# Patient Record
Sex: Male | Born: 1980 | Race: White | Hispanic: No | Marital: Single | Smoking: Current every day smoker
Health system: Southern US, Community
[De-identification: ages and names within clinical notes are randomized; demographics above are authoritative.]

## PROBLEM LIST (undated history)

## (undated) DIAGNOSIS — M1990 Unspecified osteoarthritis, unspecified site: Secondary | ICD-10-CM

## (undated) DIAGNOSIS — G43909 Migraine, unspecified, not intractable, without status migrainosus: Secondary | ICD-10-CM

## (undated) HISTORY — PX: TONSILLECTOMY: SUR1361

## (undated) HISTORY — PX: CARPAL TUNNEL RELEASE: SHX101

## (undated) HISTORY — PX: OTHER SURGICAL HISTORY: SHX169

---

## 2008-10-13 ENCOUNTER — Ambulatory Visit: Payer: Self-pay | Admitting: Internal Medicine

## 2012-10-29 ENCOUNTER — Emergency Department: Payer: Self-pay | Admitting: Emergency Medicine

## 2017-02-15 ENCOUNTER — Emergency Department
Admission: EM | Admit: 2017-02-15 | Discharge: 2017-02-15 | Disposition: A | Discharge status: Home / Self Care | Payer: Self-pay | Attending: Emergency Medicine | Admitting: Emergency Medicine

## 2017-02-15 ENCOUNTER — Encounter: Payer: Self-pay | Admitting: Emergency Medicine

## 2017-02-15 ENCOUNTER — Emergency Department: Payer: Self-pay

## 2017-02-15 DIAGNOSIS — M79642 Pain in left hand: Secondary | ICD-10-CM | POA: Insufficient documentation

## 2017-02-15 DIAGNOSIS — F17210 Nicotine dependence, cigarettes, uncomplicated: Secondary | ICD-10-CM | POA: Insufficient documentation

## 2017-02-15 MED ORDER — IBUPROFEN 600 MG PO TABS: 600.0000 mg | ORAL_TABLET | Freq: Four times a day (QID) | ORAL | 0 refills | Status: DC | PRN

## 2017-02-15 NOTE — ED Triage Notes (Signed)
Patient states that he was moving something earlier today and hit his hand on a door jam. Patient states that he continues to have pain to his left hand.

## 2017-02-15 NOTE — ED Provider Notes (Signed)
[facility] Emergency Department Provider Note  ____________________________________________  Time seen: Approximately 11:25 PM  I have reviewed the triage vital signs and the nursing notes.   HISTORY  Chief Complaint Hand Pain    HPI Jon Medina is a 36 y.o. male that presents to the Emergency Department with swelling to left hand. Patient states that his hand hit a door frame today. Pain is primarily over pinky side of his finger. No alleviating measures have been attempted. No additional injuries. No numbness, tingling.   History reviewed. No pertinent past medical history.  There are no active problems to display for this patient.   History reviewed. No pertinent surgical history.  Prior to Admission medications   Medication Sig Start Date End Date Taking? Authorizing Provider  ibuprofen (ADVIL,MOTRIN) 600 MG tablet Take 1 tablet (600 mg total) by mouth every 6 (six) hours as needed. 02/15/17   Enid Derry, PA-C    Allergies Patient has no known allergies.  No family history on file.  Social History Social History  Substance Use Topics  . Smoking status: Current Every Day Smoker  . Smokeless tobacco: Never Used  . Alcohol use Not on file     Review of Systems  Constitutional: No fever/chills Cardiovascular: No chest pain. Respiratory: No SOB. Gastrointestinal: No abdominal pain.  No nausea, no vomiting.  Musculoskeletal: Positive for hand pain. Skin: Negative for rash, abrasions, lacerations, ecchymosis. Neurological: Negative for numbness or tingling   ____________________________________________   PHYSICAL EXAM:  VITAL SIGNS: ED Triage Vitals  Enc Vitals Group     BP 02/15/17 1908 137/90     Pulse Rate 02/15/17 1907 76     Resp 02/15/17 1907 18     Temp 02/15/17 1907 97.6 F (36.4 C)     Temp Source 02/15/17 1907 Oral     SpO2 02/15/17 1907 100 %     Weight 02/15/17 1908 180 lb (81.6 kg)     Height 02/15/17 1908   (1.854 m)     Head Circumference --      Peak Flow --      Pain Score 02/15/17 1907 8     Pain Loc --      Pain Edu? --      Excl. in GC? --      Constitutional: Alert and oriented. Well appearing and in no acute distress. Eyes: Conjunctivae are normal. PERRL. EOMI. Head: Atraumatic. ENT:      Ears:      Nose: No congestion/rhinnorhea.      Mouth/Throat: Mucous membranes are moist.  Neck: No stridor.  Cardiovascular: Normal rate, regular rhythm.  Good peripheral circulation. 2+ radial pulses. Respiratory: Normal respiratory effort without tachypnea or retractions. Lungs CTAB. Good air entry to the bases with no decreased or absent breath sounds. Musculoskeletal: Full range of motion to all extremities. No gross deformities appreciated. Mild swelling and tenderness to palpation over fifth metacarpal. Neurologic:  Normal speech and language. No gross focal neurologic deficits are appreciated.  Skin:  Skin is warm, dry and intact. No rash noted.   ____________________________________________   LABS (all labs ordered are listed, but only abnormal results are displayed)  Labs Reviewed - No data to display ____________________________________________  EKG   ____________________________________________  RADIOLOGY Lexine Baton, personally viewed and evaluated these images (plain radiographs) as part of my medical decision making, as well as reviewing the written report by the radiologist.  Dg Hand Complete Left  Result Date: 02/15/2017 CLINICAL DATA:  Hand injury EXAM: LEFT HAND - COMPLETE 3+ VIEW COMPARISON:  None. FINDINGS: No acute displaced fracture or malalignment. Old ulnar styloid process deformity. No radiopaque foreign body IMPRESSION: No acute osseous abnormality Electronically Signed   By: Jasmine Pang M.D.   On: 02/15/2017 19:32    ____________________________________________    PROCEDURES  Procedure(s) performed:    Procedures    Medications  - No data to display   ____________________________________________   INITIAL IMPRESSION / ASSESSMENT AND PLAN / ED COURSE  Pertinent labs & imaging results that were available during my care of the patient were reviewed by me and considered in my medical decision making (see chart for details).  Review of the [place] CSRS was performed in accordance of the [facility] prior to dispensing any controlled drugs.     Patient presented to the emergency department for evaluation of hand pain after injury. Vital signs and exam are reassuring. X-ray negative for acute bony abnormalities. Patient will be discharged home with prescriptions for ibuprofen. Patient is to follow up with PCP as directed. Patient is given ED precautions to return to the ED for any worsening or new symptoms.     ____________________________________________  FINAL CLINICAL IMPRESSION(S) / ED DIAGNOSES  Final diagnoses:  Left hand pain      NEW MEDICATIONS STARTED DURING THIS VISIT:  Discharge Medication List as of 02/15/2017  8:12 PM    START taking these medications   Details  ibuprofen (ADVIL,MOTRIN) 600 MG tablet Take 1 tablet (600 mg total) by mouth every 6 (six) hours as needed., Starting Wed 02/15/2017, Print            This chart was dictated using voice recognition software/Dragon. Despite best efforts to proofread, errors can occur which can change the meaning. Any change was purely unintentional.    Enid Derry, PA-C 02/15/17 2328    Merrily Brittle, MD 02/15/17 [phone]

## 2017-02-15 NOTE — ED Notes (Signed)
Pt has pain and swelling to left hand.  Pt struck hand on a door frame today.

## 2019-06-11 ENCOUNTER — Other Ambulatory Visit: Payer: Self-pay

## 2019-06-11 ENCOUNTER — Emergency Department
Admission: EM | Admit: 2019-06-11 | Discharge: 2019-06-11 | Disposition: A | Discharge status: Home / Self Care | Payer: Self-pay | Attending: Emergency Medicine | Admitting: Emergency Medicine

## 2019-06-11 ENCOUNTER — Encounter: Payer: Self-pay | Admitting: Emergency Medicine

## 2019-06-11 DIAGNOSIS — F17200 Nicotine dependence, unspecified, uncomplicated: Secondary | ICD-10-CM | POA: Insufficient documentation

## 2019-06-11 DIAGNOSIS — G43901 Migraine, unspecified, not intractable, with status migrainosus: Secondary | ICD-10-CM | POA: Insufficient documentation

## 2019-06-11 HISTORY — DX: Migraine, unspecified, not intractable, without status migrainosus: G43.909

## 2019-06-11 MED ORDER — DIPHENHYDRAMINE HCL 50 MG/ML IJ SOLN
50.0000 mg | Freq: Once | INTRAMUSCULAR | Status: AC
  Administered 2019-06-11: 50 mg via INTRAMUSCULAR
  Filled 2019-06-11: qty 1

## 2019-06-11 MED ORDER — METOCLOPRAMIDE HCL 10 MG PO TABS: 10.0000 mg | ORAL_TABLET | Freq: Four times a day (QID) | ORAL | 0 refills | Status: DC | PRN

## 2019-06-11 MED ORDER — DIPHENHYDRAMINE HCL 25 MG PO CAPS: 50.0000 mg | ORAL_CAPSULE | Freq: Four times a day (QID) | ORAL | 0 refills | Status: DC | PRN

## 2019-06-11 MED ORDER — KETOROLAC TROMETHAMINE 10 MG PO TABS: 10.0000 mg | ORAL_TABLET | Freq: Four times a day (QID) | ORAL | 0 refills | Status: DC | PRN

## 2019-06-11 MED ORDER — METOCLOPRAMIDE HCL 5 MG/ML IJ SOLN
10.0000 mg | Freq: Once | INTRAMUSCULAR | Status: AC
  Administered 2019-06-11: 10 mg via INTRAMUSCULAR
  Filled 2019-06-11: qty 2

## 2019-06-11 MED ORDER — KETOROLAC TROMETHAMINE 60 MG/2ML IM SOLN
30.0000 mg | Freq: Once | INTRAMUSCULAR | Status: AC
  Administered 2019-06-11: 09:00:00 30 mg via INTRAMUSCULAR
  Filled 2019-06-11: qty 2

## 2019-06-11 NOTE — Discharge Instructions (Signed)
Continue taking toradol (ketorolac), reglan (metoclopramide), and benadryl (diphenhydramine) as prescribed every 6 hours to control headache symptoms.  You should follow up with neurology for evaluation of your recurrent headaches.  Return to the ER right away if you have sudden, severe worsening of the headache, passing out, fever, vision loss, or weakness on one side of your body.

## 2019-06-11 NOTE — ED Provider Notes (Signed)
[facility] Emergency Department Provider Note  ____________________________________________  Time seen: Approximately 8:29 AM  I have reviewed the triage vital signs and the nursing notes.   HISTORY  Chief Complaint Migraine    HPI Jon Medina is a 39 y.o. male with a history of migraine headaches and polysubstance abuse who comes to the ED c/o severe R retro-orbital headache with gradual onset 2 days ago, constant, worse with bright light, no alleviating factors.  Not thunderclap in onset. No associated weakness, paresthesia, or numbness. similar to prior headache syndrome he gets roughly twice per month.   Review of EMR shows he was admitted to [facility] Sept 2020 with fever and AMS. Had extensive workup to eval for spinal cord process, endocarditis, was eventually diagnosed with lumbar discitis and discharged on abx after full spine imaging and CT head.  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 LN bx negative for malignancy features. TEE normal.     Past Medical History:  Diagnosis Date  . Migraine      There are no problems to display for this patient.    History reviewed. No pertinent surgical history.   Prior to Admission medications   Medication Sig Start Date End Date Taking? Authorizing Provider  naproxen sodium (ALEVE) 220 MG tablet Take 220 mg by mouth 2 (two) times daily as needed.   Yes [provider]  diphenhydrAMINE (BENADRYL) 25 mg capsule Take 2 capsules (50 mg total) by mouth every 6 (six) hours as needed. 06/11/19   Sharman Cheek, MD  ketorolac (TORADOL) 10 MG tablet Take 1 tablet (10 mg total) by mouth every 6 (six) hours as needed for moderate pain. 06/11/19   Sharman Cheek, MD  metoCLOPramide (REGLAN) 10 MG tablet Take 1 tablet (10 mg total) by mouth every 6 (six) hours as needed. 06/11/19   Sharman Cheek, MD     Allergies Patient has no known allergies.   History reviewed. No pertinent family history.  Social History Social History   Tobacco Use  . Smoking status: Current Every Day Smoker  . Smokeless tobacco: Never Used  Substance Use Topics  . Alcohol use: Not on file  . Drug use: Not on file  suboxone use, prior amphetamine use  Review of Systems  Constitutional:   No fever or chills.  ENT:   No sore throat. No rhinorrhea. Cardiovascular:   No chest pain or syncope. Respiratory:   No dyspnea or cough. Gastrointestinal:   Negative for abdominal pain, vomiting and diarrhea.  Musculoskeletal:   Negative for focal pain or swelling All other systems reviewed and are negative except as documented above in ROS and HPI.  ____________________________________________   PHYSICAL EXAM:  VITAL SIGNS: ED Triage Vitals  Enc Vitals Group     BP 06/11/19 0745 124/79     Pulse Rate 06/11/19 0745 71     Resp 06/11/19 0745 16     Temp 06/11/19 0747 98 F (36.7 C)     Temp Source 06/11/19 0747 Oral     SpO2 06/11/19 0745 100 %     Weight 06/11/19 0744 180 lb (81.6 kg)     Height 06/11/19 0744 6\' 1"  (1.854 m)     Head Circumference --      Peak Flow --      Pain Score 06/11/19 0744 7     Pain Loc --  Pain Edu? --      Excl. in GC? --     Vital signs reviewed, nursing assessments reviewed.   Constitutional:   Alert and oriented. Non-toxic appearance. Eyes:   Conjunctivae are normal. EOMI. PERRL. ENT      Head:   Normocephalic and atraumatic. Symmetric TA pulsations, non tender      Nose:   Wearing a mask.      Mouth/Throat:   Wearing a mask.      Neck:   No meningismus. Full ROM. Hematological/Lymphatic/Immunilogical:   No cervical lymphadenopathy. Cardiovascular:   RRR. Symmetric bilateral radial and DP pulses.  No murmurs. Cap refill less than 2 seconds. Respiratory:   Normal respiratory effort without tachypnea/retractions. Breath sounds are clear and  equal bilaterally. No wheezes/rales/rhonchi. Musculoskeletal:   Normal range of motion in all extremities. No joint effusions.  No lower extremity tenderness.  No edema. Neurologic:   Normal speech and language.  CN 3-12 intact Normal cerebellar function Motor grossly intact. No acute focal neurologic deficits are appreciated.  Skin:    Skin is warm, dry and intact. No rash noted.  No petechiae, purpura, or bullae.  ____________________________________________    LABS (pertinent positives/negatives) (all labs ordered are listed, but only abnormal results are displayed) Labs Reviewed - No data to display ____________________________________________   EKG    ____________________________________________    RADIOLOGY  No results found.  ____________________________________________   PROCEDURES Procedures  ____________________________________________    CLINICAL IMPRESSION / ASSESSMENT AND PLAN / ED COURSE  Medications ordered in the ED: Medications  ketorolac (TORADOL) injection 30 mg (30 mg Intramuscular Given 06/11/19 0841)  metoCLOPramide (REGLAN) injection 10 mg (10 mg Intramuscular Given 06/11/19 0841)  diphenhydrAMINE (BENADRYL) injection 50 mg (50 mg Intramuscular Given 06/11/19 0841)    Pertinent labs & imaging results that were available during my care of the patient were reviewed by me and considered in my medical decision making (see chart for details).  Jon Medina was evaluated in Emergency Department on 06/11/2019 for the symptoms described in the history of present illness. He was evaluated in the context of the global COVID-19 pandemic, which necessitated consideration that the patient might be at risk for infection with the SARS-CoV-2 virus that causes COVID-19. Institutional protocols and algorithms that pertain to the evaluation of patients at risk for COVID-19 are in a state of rapid change based on information released by regulatory bodies including  the CDC and federal and state organizations. These policies and algorithms were followed during the patient's care in the ED.   Pt p/w severe R temporal headache, c/w his established frequent headache pattern. Vital signs are normal.  Considering the patient's symptoms, medical history, and physical examination today, I have low suspicion for ischemic stroke, intracranial hemorrhage, meningitis, encephalitis, carotid or vertebral dissection, venous sinus thrombosis, MS, intracranial hypertension, glaucoma, CRAO, CRVO, or temporal arteritis.  Will tx symptoms with IM toradol 30mg , Reglan 10mg , Benadryl 50mg  and reassess.  ----------------------------------------- 10:37 AM on 06/11/2019 -----------------------------------------  Feels much better. Will continue reglan, toradol, benadryl PO, f/u neuro. Return precautions for worsening symptoms.       ____________________________________________   FINAL CLINICAL IMPRESSION(S) / ED DIAGNOSES    Final diagnoses:  Migraine with status migrainosus, not intractable, unspecified migraine type     ED Discharge Orders         Ordered    ketorolac (TORADOL) 10 MG tablet  Every 6 hours PRN     06/11/19 1035    diphenhydrAMINE (  BENADRYL) 25 mg capsule  Every 6 hours PRN     06/11/19 1035    metoCLOPramide (REGLAN) 10 MG tablet  Every 6 hours PRN     06/11/19 1035          Portions of this note were generated with dragon dictation software. Dictation errors may occur despite best attempts at proofreading.   Carrie Mew, MD 06/11/19 1038

## 2019-06-11 NOTE — ED Triage Notes (Addendum)
Pt here for migraine. Hx of same but worse than normal migraine per pt.  + photophobia and nausea.  C/o blurry vision to right eye. Pain to right side.  Ambulatory. VSS.  Had headache yesterday but got a little better, then came back worse today

## 2019-08-26 ENCOUNTER — Encounter: Payer: Self-pay | Admitting: Emergency Medicine

## 2019-08-26 ENCOUNTER — Emergency Department: Payer: Medicaid Other

## 2019-08-26 ENCOUNTER — Other Ambulatory Visit: Payer: Self-pay

## 2019-08-26 DIAGNOSIS — W01198A Fall on same level from slipping, tripping and stumbling with subsequent striking against other object, initial encounter: Secondary | ICD-10-CM | POA: Insufficient documentation

## 2019-08-26 DIAGNOSIS — F17200 Nicotine dependence, unspecified, uncomplicated: Secondary | ICD-10-CM | POA: Insufficient documentation

## 2019-08-26 DIAGNOSIS — S32010A Wedge compression fracture of first lumbar vertebra, initial encounter for closed fracture: Secondary | ICD-10-CM | POA: Insufficient documentation

## 2019-08-26 DIAGNOSIS — Y939 Activity, unspecified: Secondary | ICD-10-CM | POA: Insufficient documentation

## 2019-08-26 DIAGNOSIS — Y999 Unspecified external cause status: Secondary | ICD-10-CM | POA: Insufficient documentation

## 2019-08-26 DIAGNOSIS — R519 Headache, unspecified: Secondary | ICD-10-CM | POA: Insufficient documentation

## 2019-08-26 DIAGNOSIS — R079 Chest pain, unspecified: Secondary | ICD-10-CM | POA: Insufficient documentation

## 2019-08-26 DIAGNOSIS — R109 Unspecified abdominal pain: Secondary | ICD-10-CM | POA: Insufficient documentation

## 2019-08-26 DIAGNOSIS — Y92008 Other place in unspecified non-institutional (private) residence as the place of occurrence of the external cause: Secondary | ICD-10-CM | POA: Insufficient documentation

## 2019-08-26 DIAGNOSIS — M542 Cervicalgia: Secondary | ICD-10-CM | POA: Insufficient documentation

## 2019-08-26 LAB — COMPREHENSIVE METABOLIC PANEL
ALT: 26 U/L (ref 0–44)
AST: 41 U/L (ref 15–41)
Albumin: 4.3 g/dL (ref 3.5–5.0)
Alkaline Phosphatase: 50 U/L (ref 38–126)
Anion gap: 11 (ref 5–15)
BUN: 23 mg/dL — ABNORMAL HIGH (ref 6–20)
CO2: 20 mmol/L — ABNORMAL LOW (ref 22–32)
Calcium: 9.2 mg/dL (ref 8.9–10.3)
Chloride: 108 mmol/L (ref 98–111)
Creatinine, Ser: 0.91 mg/dL (ref 0.61–1.24)
GFR calc Af Amer: >60 mL/min (ref >60)
GFR calc non Af Amer: >60 mL/min (ref >60)
Glucose, Bld: 117 mg/dL — ABNORMAL HIGH (ref 70–99)
Potassium: 4.1 mmol/L (ref 3.5–5.1)
Sodium: 139 mmol/L (ref 135–145)
Total Bilirubin: 1.5 mg/dL — ABNORMAL HIGH (ref 0.3–1.2)
Total Protein: 6.9 g/dL (ref 6.5–8.1)

## 2019-08-26 LAB — CBC WITH DIFFERENTIAL/PLATELET
Abs Immature Granulocytes: 0.06 10*3/uL (ref 0.00–0.07)
Basophils Absolute: 0 10*3/uL (ref 0.0–0.1)
Basophils Relative: 0 %
Eosinophils Absolute: 0.1 10*3/uL (ref 0.0–0.5)
Eosinophils Relative: 1 %
HCT: 38 % — ABNORMAL LOW (ref 39.0–52.0)
Hemoglobin: 13.1 g/dL (ref 13.0–17.0)
Immature Granulocytes: 1 %
Lymphocytes Relative: 30 %
Lymphs Abs: 2.5 10*3/uL (ref 0.7–4.0)
MCH: 31.1 pg (ref 26.0–34.0)
MCHC: 34.5 g/dL (ref 30.0–36.0)
MCV: 90.3 fL (ref 80.0–100.0)
Monocytes Absolute: 0.9 10*3/uL (ref 0.1–1.0)
Monocytes Relative: 11 %
Neutro Abs: 4.8 10*3/uL (ref 1.7–7.7)
Neutrophils Relative %: 57 %
Platelets: 241 10*3/uL (ref 150–400)
RBC: 4.21 MIL/uL — ABNORMAL LOW (ref 4.22–5.81)
RDW: 12.5 % (ref 11.5–15.5)
WBC: 8.4 10*3/uL (ref 4.0–10.5)
nRBC: 0 % (ref 0.0–0.2)

## 2019-08-26 LAB — ETHANOL: Alcohol, Ethyl (B): <10 mg/dL (ref <10)

## 2019-08-26 MED ORDER — SODIUM CHLORIDE 0.9 % IV BOLUS
1000.0000 mL | Freq: Once | INTRAVENOUS | Status: AC
  Administered 2019-08-27: 1000 mL via INTRAVENOUS

## 2019-08-26 MED ORDER — IOHEXOL 300 MG/ML  SOLN
100.0000 mL | Freq: Once | INTRAMUSCULAR | Status: AC | PRN
  Administered 2019-08-26: 100 mL via INTRAVENOUS

## 2019-08-26 NOTE — ED Triage Notes (Signed)
Pt to ED via POV with c/o neck and back pain/ Pt st he jumped off his roof onto a trampoline and immediatly heard a "snap and crunch"/ Pt  St he landed on his bottom and then back. Pt in obvious distress. A cervical collar was placed onto the pt. Pt walked into the ER and drove himself here. Pt A&Ox4

## 2019-08-27 ENCOUNTER — Emergency Department
Admission: EM | Admit: 2019-08-27 | Discharge: 2019-08-27 | Disposition: A | Discharge status: Home / Self Care | Payer: Medicaid Other | Attending: Emergency Medicine | Admitting: Emergency Medicine

## 2019-08-27 DIAGNOSIS — R52 Pain, unspecified: Secondary | ICD-10-CM

## 2019-08-27 DIAGNOSIS — S32010A Wedge compression fracture of first lumbar vertebra, initial encounter for closed fracture: Secondary | ICD-10-CM

## 2019-08-27 MED ORDER — ONDANSETRON HCL 4 MG/2ML IJ SOLN
4.0000 mg | Freq: Once | INTRAMUSCULAR | Status: AC
  Administered 2019-08-27: 4 mg via INTRAVENOUS
  Filled 2019-08-27: qty 2

## 2019-08-27 MED ORDER — MORPHINE SULFATE (PF) 4 MG/ML IV SOLN
4.0000 mg | Freq: Once | INTRAVENOUS | Status: AC
  Administered 2019-08-27: 4 mg via INTRAVENOUS
  Filled 2019-08-27: qty 1

## 2019-08-27 MED ORDER — OXYCODONE-ACETAMINOPHEN 5-325 MG PO TABS: 1.0000 | ORAL_TABLET | ORAL | 0 refills | Status: AC | PRN

## 2019-08-27 NOTE — ED Notes (Signed)
Applied ccollar to pts neck

## 2019-08-27 NOTE — ED Provider Notes (Addendum)
[facility] Emergency Department Provider Note  ____________________________________________   First MD Initiated Contact with Patient 08/27/19 [id number]     (approximate)  I have reviewed the triage vital signs and the nursing notes.   HISTORY  Chief Complaint Back Pain and Neck Pain    HPI Jon Medina is a 39 y.o. male presents to the emergency department secondary to neck and low back pain after jumping off his roof and onto a trampoline.  Patient states that he fell on his buttocks and immediately felt a "pop and crunch in his lower back with 10 out of 10 pain.  Patient denies any lower extremity weakness numbness.  Patient denies any head injury or loss of consciousness.  Patient denies any chest or abdominal pain.       Past Medical History:  Diagnosis Date  . Migraine     There are no problems to display for this patient.   History reviewed. No pertinent surgical history.  Prior to Admission medications   Medication Sig Start Date End Date Taking? Authorizing Provider  acetaminophen (TYLENOL) 325 MG tablet Take 650 mg by mouth every 6 (six) hours as needed for mild pain, moderate pain, fever or headache.   Yes [provider]  aspirin-sod bicarb-citric acid (ALKA-SELTZER) 325 MG TBEF tablet Take 325 mg by mouth every 6 (six) hours as needed.   Yes [provider]  buprenorphine-naloxone (SUBOXONE) 8-2 mg SUBL SL tablet Place 1 tablet under the tongue daily. 02/22/19   [provider]    Allergies Patient has no known allergies.  History reviewed. No pertinent family history.  Social History Social History   Tobacco Use  . Smoking status: Current Every Day Smoker  . Smokeless tobacco: Never Used  Substance Use Topics  . Alcohol use: Not on file  . Drug use: Not on file    Review of Systems Constitutional: No fever/chills Eyes: No visual changes. ENT: No sore throat. Cardiovascular: Denies chest  pain. Respiratory: Denies shortness of breath. Gastrointestinal: No abdominal pain.  No nausea, no vomiting.  No diarrhea.  No constipation. Genitourinary: Negative for dysuria. Musculoskeletal: Positive for neck pain.  Positive for back pain. Integumentary: Negative for rash. Neurological: Negative for headaches, focal weakness or numbness.  ____________________________________________   PHYSICAL EXAM:  VITAL SIGNS: ED Triage Vitals  Enc Vitals Group     BP 08/26/19 2317 (!) 154/87     Pulse Rate 08/26/19 2250 (!) 130     Resp 08/26/19 2250 (!) 25     Temp 08/26/19 2317 98.2 F (36.8 C)     Temp Source 08/26/19 2317 Oral     SpO2 08/26/19 2317 99 %     Weight 08/26/19 2301 83.9 kg (185 lb)     Height 08/26/19 2301 1.854 m (6\' 1" )     Head Circumference --      Peak Flow --      Pain Score 08/26/19 2300 10     Pain Loc --      Pain Edu? --      Excl. in GC? --     Constitutional: Alert and oriented.  Apparent discomfort Eyes: Conjunctivae are normal.  Head: Atraumatic. Mouth/Throat: Patient is wearing a mask. Neck: No stridor.  No meningeal signs.   Cardiovascular: Normal rate, regular rhythm. Good peripheral circulation. Grossly normal heart sounds. Respiratory: Normal respiratory effort.  No retractions. Gastrointestinal: Soft and nontender. No distention.  Musculoskeletal: Pain to palpation L1 and L2.  Pain  with palpation of lumbar paraspinal muscle. Neurologic:  Normal speech and language. No gross focal neurologic deficits are appreciated.  Skin:  Skin is warm, dry and intact. Psychiatric: Mood and affect are normal. Speech and behavior are normal.  ____________________________________________   LABS (all labs ordered are listed, but only abnormal results are displayed)    RADIOLOGY I, Guin N Quinlan Mcfall, personally viewed and evaluated these images (plain radiographs) as part of my medical decision making, as well as reviewing the written report by the  radiologist.  ED MD interpretation: CT scans revealed no intracranial abnormality chest abdomen pelvis lumbar spine thoracic spine and cervical spine was also performed.  Acute compression fracture involving the superior endplate of L1 vertebral body without any retropulsion and 50% loss of height anteriorly was noted per radiologist.  Official radiology report(s): CT Head Wo Contrast  Result Date: 08/27/2019 CLINICAL DATA:  Acute pain due to trauma. Neck and back pain status post jumping off a roof onto a trampoline EXAM: CT HEAD WITHOUT CONTRAST CT CERVICAL SPINE WITHOUT CONTRAST CT CHEST, ABDOMEN AND PELVIS WITH CONTRAST CT THORACIC AND LUMBAR SPINE WITHOUT CONTRAST TECHNIQUE: Contiguous axial images were obtained from the base of the skull through the vertex without intravenous contrast. Multidetector CT imaging of the cervical spine was performed without intravenous contrast. Multiplanar CT image reconstructions were also generated. Multidetector CT imaging of the chest, abdomen and pelvis was performed following the standard protocol during bolus administration of intravenous contrast. Multiplanar CT images of the thoracic and lumbar spine were reconstructed from contemporary CT of the Chest, Abdomen, and Pelvis CONTRAST:  OMNIPAQUE IOHEXOL 300 MG/ML  SOLN COMPARISON:  None. FINDINGS: CT HEAD FINDINGS Brain: No evidence of acute infarction, hemorrhage, hydrocephalus, extra-axial collection or mass lesion/mass effect. Vascular: No hyperdense vessel or unexpected calcification. Skull: Normal. Negative for fracture or focal lesion. Sinuses/Orbits: There are mucosal retention cysts within the bilateral maxillary sinuses, right greater than left. The remaining paranasal sinuses and mastoid air cells are essentially clear. Other: None. CT CERVICAL FINDINGS Alignment: Normal. Skull base and vertebrae: No acute fracture. No primary bone lesion or focal pathologic process. Soft tissues and spinal canal: No  prevertebral fluid or swelling. No visible canal hematoma. Disc levels: Minimal disc height loss is noted the lower cervical segments. Other:  None. CT CHEST FINDINGS Cardiovascular: The heart size is normal. There is no significant pericardial effusion. No evidence for thoracic aortic aneurysm or dissection. No evidence for large centrally located pulmonary embolism. Mediastinum/Nodes: --No mediastinal or hilar lymphadenopathy. --No axillary lymphadenopathy. --No supraclavicular lymphadenopathy. --Normal thyroid gland. --The esophagus is unremarkable Lungs/Pleura: There is a 5 mm pulmonary nodule in the right lower lobe (axial series 5, image 94). There are additional scattered bilateral pulmonary nodules measure less than 5 mm. There is no pneumothorax or large pleural effusion. No focal infiltrate. Musculoskeletal: No chest wall abnormality. No acute or significant osseous findings. CT ABDOMEN PELVIS FINDINGS Hepatobiliary: The liver is normal. Normal gallbladder.There is no biliary ductal dilation. Pancreas: Normal contours without ductal dilatation. No peripancreatic fluid collection. Spleen: Unremarkable. Adrenals/Urinary Tract: --Adrenal glands: Unremarkable. --Right kidney/ureter: No hydronephrosis or radiopaque kidney stones. --Left kidney/ureter: There are nonobstructing stones in the lower pole the left kidney. --Urinary bladder: Unremarkable. Stomach/Bowel: --Stomach/Duodenum: No hiatal hernia or other gastric abnormality. Normal duodenal course and caliber. --Small bowel: Unremarkable. --Colon: Unremarkable. --Appendix: Normal. Vascular/Lymphatic: Normal course and caliber of the major abdominal vessels. --No retroperitoneal lymphadenopathy. --No mesenteric lymphadenopathy. --No pelvic or inguinal lymphadenopathy. Reproductive: Unremarkable Other: No  ascites or free air. The abdominal wall is normal. Musculoskeletal. There is an acute compression fracture involving the superior endplate of the L1  vertebral body with approximately 15% height loss anteriorly. There is no retropulsion. There are 5 lumbar type vertebral bodies. Advanced degenerative changes are noted at the L5-S1 level where there is moderate disc height loss and developing facet arthrosis. There is a unilateral pars defect at L4 without significant anterolisthesis. There is a broad-based posterior disc bulge at the L4-L5 level without significant spinal canal stenosis. There is a broad-based posterior disc bulge at the L5-S1 level resulting in mild spinal canal narrowing. IMPRESSION: 1. Acute compression fracture involving the superior endplate of the L1 vertebral body with approximately 15% height loss anteriorly, without retropulsion. 2. No acute thoracic spine fracture. 3. No acute abnormalities of the cervical spine. 4. No acute intracranial abnormality. 5. No solid organ injury within the chest, abdomen, or pelvis. 6. Nonobstructing left-sided nephrolithiasis. 7. Pulmonary nodules, measuring up to 5 mm. No follow-up needed if patient is low risk. Non-contrast chest CT can be considered in 12 months if patient is high risk. This recommendation follows the consensus statement: Guidelines for Management of Incidental Pulmonary Nodules Detected on CT Images: From the Fleischner Society 2017; Radiology 2017; 284:228-243. Electronically Signed   By: Katherine Mantle M.D.   On: 08/27/2019 00:53   CT Chest W Contrast  Result Date: 08/27/2019 CLINICAL DATA:  Acute pain due to trauma. Neck and back pain status post jumping off a roof onto a trampoline EXAM: CT HEAD WITHOUT CONTRAST CT CERVICAL SPINE WITHOUT CONTRAST CT CHEST, ABDOMEN AND PELVIS WITH CONTRAST CT THORACIC AND LUMBAR SPINE WITHOUT CONTRAST TECHNIQUE: Contiguous axial images were obtained from the base of the skull through the vertex without intravenous contrast. Multidetector CT imaging of the cervical spine was performed without intravenous contrast. Multiplanar CT image  reconstructions were also generated. Multidetector CT imaging of the chest, abdomen and pelvis was performed following the standard protocol during bolus administration of intravenous contrast. Multiplanar CT images of the thoracic and lumbar spine were reconstructed from contemporary CT of the Chest, Abdomen, and Pelvis CONTRAST:  OMNIPAQUE IOHEXOL 300 MG/ML  SOLN COMPARISON:  None. FINDINGS: CT HEAD FINDINGS Brain: No evidence of acute infarction, hemorrhage, hydrocephalus, extra-axial collection or mass lesion/mass effect. Vascular: No hyperdense vessel or unexpected calcification. Skull: Normal. Negative for fracture or focal lesion. Sinuses/Orbits: There are mucosal retention cysts within the bilateral maxillary sinuses, right greater than left. The remaining paranasal sinuses and mastoid air cells are essentially clear. Other: None. CT CERVICAL FINDINGS Alignment: Normal. Skull base and vertebrae: No acute fracture. No primary bone lesion or focal pathologic process. Soft tissues and spinal canal: No prevertebral fluid or swelling. No visible canal hematoma. Disc levels: Minimal disc height loss is noted the lower cervical segments. Other:  None. CT CHEST FINDINGS Cardiovascular: The heart size is normal. There is no significant pericardial effusion. No evidence for thoracic aortic aneurysm or dissection. No evidence for large centrally located pulmonary embolism. Mediastinum/Nodes: --No mediastinal or hilar lymphadenopathy. --No axillary lymphadenopathy. --No supraclavicular lymphadenopathy. --Normal thyroid gland. --The esophagus is unremarkable Lungs/Pleura: There is a 5 mm pulmonary nodule in the right lower lobe (axial series 5, image 94). There are additional scattered bilateral pulmonary nodules measure less than 5 mm. There is no pneumothorax or large pleural effusion. No focal infiltrate. Musculoskeletal: No chest wall abnormality. No acute or significant osseous findings. CT ABDOMEN PELVIS  FINDINGS Hepatobiliary: The liver is normal.  Normal gallbladder.There is no biliary ductal dilation. Pancreas: Normal contours without ductal dilatation. No peripancreatic fluid collection. Spleen: Unremarkable. Adrenals/Urinary Tract: --Adrenal glands: Unremarkable. --Right kidney/ureter: No hydronephrosis or radiopaque kidney stones. --Left kidney/ureter: There are nonobstructing stones in the lower pole the left kidney. --Urinary bladder: Unremarkable. Stomach/Bowel: --Stomach/Duodenum: No hiatal hernia or other gastric abnormality. Normal duodenal course and caliber. --Small bowel: Unremarkable. --Colon: Unremarkable. --Appendix: Normal. Vascular/Lymphatic: Normal course and caliber of the major abdominal vessels. --No retroperitoneal lymphadenopathy. --No mesenteric lymphadenopathy. --No pelvic or inguinal lymphadenopathy. Reproductive: Unremarkable Other: No ascites or free air. The abdominal wall is normal. Musculoskeletal. There is an acute compression fracture involving the superior endplate of the L1 vertebral body with approximately 15% height loss anteriorly. There is no retropulsion. There are 5 lumbar type vertebral bodies. Advanced degenerative changes are noted at the L5-S1 level where there is moderate disc height loss and developing facet arthrosis. There is a unilateral pars defect at L4 without significant anterolisthesis. There is a broad-based posterior disc bulge at the L4-L5 level without significant spinal canal stenosis. There is a broad-based posterior disc bulge at the L5-S1 level resulting in mild spinal canal narrowing. IMPRESSION: 1. Acute compression fracture involving the superior endplate of the L1 vertebral body with approximately 15% height loss anteriorly, without retropulsion. 2. No acute thoracic spine fracture. 3. No acute abnormalities of the cervical spine. 4. No acute intracranial abnormality. 5. No solid organ injury within the chest, abdomen, or pelvis. 6. Nonobstructing  left-sided nephrolithiasis. 7. Pulmonary nodules, measuring up to 5 mm. No follow-up needed if patient is low risk. Non-contrast chest CT can be considered in 12 months if patient is high risk. This recommendation follows the consensus statement: Guidelines for Management of Incidental Pulmonary Nodules Detected on CT Images: From the Fleischner Society 2017; Radiology 2017; 284:228-243. Electronically Signed   By: Katherine Mantle M.D.   On: 08/27/2019 00:53   CT Cervical Spine Wo Contrast  Result Date: 08/27/2019 CLINICAL DATA:  Acute pain due to trauma. Neck and back pain status post jumping off a roof onto a trampoline EXAM: CT HEAD WITHOUT CONTRAST CT CERVICAL SPINE WITHOUT CONTRAST CT CHEST, ABDOMEN AND PELVIS WITH CONTRAST CT THORACIC AND LUMBAR SPINE WITHOUT CONTRAST TECHNIQUE: Contiguous axial images were obtained from the base of the skull through the vertex without intravenous contrast. Multidetector CT imaging of the cervical spine was performed without intravenous contrast. Multiplanar CT image reconstructions were also generated. Multidetector CT imaging of the chest, abdomen and pelvis was performed following the standard protocol during bolus administration of intravenous contrast. Multiplanar CT images of the thoracic and lumbar spine were reconstructed from contemporary CT of the Chest, Abdomen, and Pelvis CONTRAST:  OMNIPAQUE IOHEXOL 300 MG/ML  SOLN COMPARISON:  None. FINDINGS: CT HEAD FINDINGS Brain: No evidence of acute infarction, hemorrhage, hydrocephalus, extra-axial collection or mass lesion/mass effect. Vascular: No hyperdense vessel or unexpected calcification. Skull: Normal. Negative for fracture or focal lesion. Sinuses/Orbits: There are mucosal retention cysts within the bilateral maxillary sinuses, right greater than left. The remaining paranasal sinuses and mastoid air cells are essentially clear. Other: None. CT CERVICAL FINDINGS Alignment: Normal. Skull base and  vertebrae: No acute fracture. No primary bone lesion or focal pathologic process. Soft tissues and spinal canal: No prevertebral fluid or swelling. No visible canal hematoma. Disc levels: Minimal disc height loss is noted the lower cervical segments. Other:  None. CT CHEST FINDINGS Cardiovascular: The heart size is normal. There is no significant pericardial effusion. No evidence  for thoracic aortic aneurysm or dissection. No evidence for large centrally located pulmonary embolism. Mediastinum/Nodes: --No mediastinal or hilar lymphadenopathy. --No axillary lymphadenopathy. --No supraclavicular lymphadenopathy. --Normal thyroid gland. --The esophagus is unremarkable Lungs/Pleura: There is a 5 mm pulmonary nodule in the right lower lobe (axial series 5, image 94). There are additional scattered bilateral pulmonary nodules measure less than 5 mm. There is no pneumothorax or large pleural effusion. No focal infiltrate. Musculoskeletal: No chest wall abnormality. No acute or significant osseous findings. CT ABDOMEN PELVIS FINDINGS Hepatobiliary: The liver is normal. Normal gallbladder.There is no biliary ductal dilation. Pancreas: Normal contours without ductal dilatation. No peripancreatic fluid collection. Spleen: Unremarkable. Adrenals/Urinary Tract: --Adrenal glands: Unremarkable. --Right kidney/ureter: No hydronephrosis or radiopaque kidney stones. --Left kidney/ureter: There are nonobstructing stones in the lower pole the left kidney. --Urinary bladder: Unremarkable. Stomach/Bowel: --Stomach/Duodenum: No hiatal hernia or other gastric abnormality. Normal duodenal course and caliber. --Small bowel: Unremarkable. --Colon: Unremarkable. --Appendix: Normal. Vascular/Lymphatic: Normal course and caliber of the major abdominal vessels. --No retroperitoneal lymphadenopathy. --No mesenteric lymphadenopathy. --No pelvic or inguinal lymphadenopathy. Reproductive: Unremarkable Other: No ascites or free air. The abdominal wall  is normal. Musculoskeletal. There is an acute compression fracture involving the superior endplate of the L1 vertebral body with approximately 15% height loss anteriorly. There is no retropulsion. There are 5 lumbar type vertebral bodies. Advanced degenerative changes are noted at the L5-S1 level where there is moderate disc height loss and developing facet arthrosis. There is a unilateral pars defect at L4 without significant anterolisthesis. There is a broad-based posterior disc bulge at the L4-L5 level without significant spinal canal stenosis. There is a broad-based posterior disc bulge at the L5-S1 level resulting in mild spinal canal narrowing. IMPRESSION: 1. Acute compression fracture involving the superior endplate of the L1 vertebral body with approximately 15% height loss anteriorly, without retropulsion. 2. No acute thoracic spine fracture. 3. No acute abnormalities of the cervical spine. 4. No acute intracranial abnormality. 5. No solid organ injury within the chest, abdomen, or pelvis. 6. Nonobstructing left-sided nephrolithiasis. 7. Pulmonary nodules, measuring up to 5 mm. No follow-up needed if patient is low risk. Non-contrast chest CT can be considered in 12 months if patient is high risk. This recommendation follows the consensus statement: Guidelines for Management of Incidental Pulmonary Nodules Detected on CT Images: From the Fleischner Society 2017; Radiology 2017; 284:228-243. Electronically Signed   By: Katherine Mantle M.D.   On: 08/27/2019 00:53   CT Abdomen Pelvis W Contrast  Result Date: 08/27/2019 CLINICAL DATA:  Acute pain due to trauma. Neck and back pain status post jumping off a roof onto a trampoline EXAM: CT HEAD WITHOUT CONTRAST CT CERVICAL SPINE WITHOUT CONTRAST CT CHEST, ABDOMEN AND PELVIS WITH CONTRAST CT THORACIC AND LUMBAR SPINE WITHOUT CONTRAST TECHNIQUE: Contiguous axial images were obtained from the base of the skull through the vertex without intravenous contrast.  Multidetector CT imaging of the cervical spine was performed without intravenous contrast. Multiplanar CT image reconstructions were also generated. Multidetector CT imaging of the chest, abdomen and pelvis was performed following the standard protocol during bolus administration of intravenous contrast. Multiplanar CT images of the thoracic and lumbar spine were reconstructed from contemporary CT of the Chest, Abdomen, and Pelvis CONTRAST:  OMNIPAQUE IOHEXOL 300 MG/ML  SOLN COMPARISON:  None. FINDINGS: CT HEAD FINDINGS Brain: No evidence of acute infarction, hemorrhage, hydrocephalus, extra-axial collection or mass lesion/mass effect. Vascular: No hyperdense vessel or unexpected calcification. Skull: Normal. Negative for fracture or focal lesion.  Sinuses/Orbits: There are mucosal retention cysts within the bilateral maxillary sinuses, right greater than left. The remaining paranasal sinuses and mastoid air cells are essentially clear. Other: None. CT CERVICAL FINDINGS Alignment: Normal. Skull base and vertebrae: No acute fracture. No primary bone lesion or focal pathologic process. Soft tissues and spinal canal: No prevertebral fluid or swelling. No visible canal hematoma. Disc levels: Minimal disc height loss is noted the lower cervical segments. Other:  None. CT CHEST FINDINGS Cardiovascular: The heart size is normal. There is no significant pericardial effusion. No evidence for thoracic aortic aneurysm or dissection. No evidence for large centrally located pulmonary embolism. Mediastinum/Nodes: --No mediastinal or hilar lymphadenopathy. --No axillary lymphadenopathy. --No supraclavicular lymphadenopathy. --Normal thyroid gland. --The esophagus is unremarkable Lungs/Pleura: There is a 5 mm pulmonary nodule in the right lower lobe (axial series 5, image 94). There are additional scattered bilateral pulmonary nodules measure less than 5 mm. There is no pneumothorax or large pleural effusion. No focal  infiltrate. Musculoskeletal: No chest wall abnormality. No acute or significant osseous findings. CT ABDOMEN PELVIS FINDINGS Hepatobiliary: The liver is normal. Normal gallbladder.There is no biliary ductal dilation. Pancreas: Normal contours without ductal dilatation. No peripancreatic fluid collection. Spleen: Unremarkable. Adrenals/Urinary Tract: --Adrenal glands: Unremarkable. --Right kidney/ureter: No hydronephrosis or radiopaque kidney stones. --Left kidney/ureter: There are nonobstructing stones in the lower pole the left kidney. --Urinary bladder: Unremarkable. Stomach/Bowel: --Stomach/Duodenum: No hiatal hernia or other gastric abnormality. Normal duodenal course and caliber. --Small bowel: Unremarkable. --Colon: Unremarkable. --Appendix: Normal. Vascular/Lymphatic: Normal course and caliber of the major abdominal vessels. --No retroperitoneal lymphadenopathy. --No mesenteric lymphadenopathy. --No pelvic or inguinal lymphadenopathy. Reproductive: Unremarkable Other: No ascites or free air. The abdominal wall is normal. Musculoskeletal. There is an acute compression fracture involving the superior endplate of the L1 vertebral body with approximately 15% height loss anteriorly. There is no retropulsion. There are 5 lumbar type vertebral bodies. Advanced degenerative changes are noted at the L5-S1 level where there is moderate disc height loss and developing facet arthrosis. There is a unilateral pars defect at L4 without significant anterolisthesis. There is a broad-based posterior disc bulge at the L4-L5 level without significant spinal canal stenosis. There is a broad-based posterior disc bulge at the L5-S1 level resulting in mild spinal canal narrowing. IMPRESSION: 1. Acute compression fracture involving the superior endplate of the L1 vertebral body with approximately 15% height loss anteriorly, without retropulsion. 2. No acute thoracic spine fracture. 3. No acute abnormalities of the cervical spine. 4.  No acute intracranial abnormality. 5. No solid organ injury within the chest, abdomen, or pelvis. 6. Nonobstructing left-sided nephrolithiasis. 7. Pulmonary nodules, measuring up to 5 mm. No follow-up needed if patient is low risk. Non-contrast chest CT can be considered in 12 months if patient is high risk. This recommendation follows the consensus statement: Guidelines for Management of Incidental Pulmonary Nodules Detected on CT Images: From the Fleischner Society 2017; Radiology 2017; 284:228-243. Electronically Signed   By: Katherine Mantle M.D.   On: 08/27/2019 00:53   CT T-SPINE NO CHARGE  Result Date: 08/27/2019 CLINICAL DATA:  Acute pain due to trauma. Neck and back pain status post jumping off a roof onto a trampoline EXAM: CT HEAD WITHOUT CONTRAST CT CERVICAL SPINE WITHOUT CONTRAST CT CHEST, ABDOMEN AND PELVIS WITH CONTRAST CT THORACIC AND LUMBAR SPINE WITHOUT CONTRAST TECHNIQUE: Contiguous axial images were obtained from the base of the skull through the vertex without intravenous contrast. Multidetector CT imaging of the cervical spine was performed without intravenous contrast.  Multiplanar CT image reconstructions were also generated. Multidetector CT imaging of the chest, abdomen and pelvis was performed following the standard protocol during bolus administration of intravenous contrast. Multiplanar CT images of the thoracic and lumbar spine were reconstructed from contemporary CT of the Chest, Abdomen, and Pelvis CONTRAST:  127mL OMNIPAQUE IOHEXOL 300 MG/ML  SOLN COMPARISON:  None. FINDINGS: CT HEAD FINDINGS Brain: No evidence of acute infarction, hemorrhage, hydrocephalus, extra-axial collection or mass lesion/mass effect. Vascular: No hyperdense vessel or unexpected calcification. Skull: Normal. Negative for fracture or focal lesion. Sinuses/Orbits: There are mucosal retention cysts within the bilateral maxillary sinuses, right greater than left. The remaining paranasal sinuses and mastoid air  cells are essentially clear. Other: None. CT CERVICAL FINDINGS Alignment: Normal. Skull base and vertebrae: No acute fracture. No primary bone lesion or focal pathologic process. Soft tissues and spinal canal: No prevertebral fluid or swelling. No visible canal hematoma. Disc levels: Minimal disc height loss is noted the lower cervical segments. Other:  None. CT CHEST FINDINGS Cardiovascular: The heart size is normal. There is no significant pericardial effusion. No evidence for thoracic aortic aneurysm or dissection. No evidence for large centrally located pulmonary embolism. Mediastinum/Nodes: --No mediastinal or hilar lymphadenopathy. --No axillary lymphadenopathy. --No supraclavicular lymphadenopathy. --Normal thyroid gland. --The esophagus is unremarkable Lungs/Pleura: There is a 5 mm pulmonary nodule in the right lower lobe (axial series 5, image 94). There are additional scattered bilateral pulmonary nodules measure less than 5 mm. There is no pneumothorax or large pleural effusion. No focal infiltrate. Musculoskeletal: No chest wall abnormality. No acute or significant osseous findings. CT ABDOMEN PELVIS FINDINGS Hepatobiliary: The liver is normal. Normal gallbladder.There is no biliary ductal dilation. Pancreas: Normal contours without ductal dilatation. No peripancreatic fluid collection. Spleen: Unremarkable. Adrenals/Urinary Tract: --Adrenal glands: Unremarkable. --Right kidney/ureter: No hydronephrosis or radiopaque kidney stones. --Left kidney/ureter: There are nonobstructing stones in the lower pole the left kidney. --Urinary bladder: Unremarkable. Stomach/Bowel: --Stomach/Duodenum: No hiatal hernia or other gastric abnormality. Normal duodenal course and caliber. --Small bowel: Unremarkable. --Colon: Unremarkable. --Appendix: Normal. Vascular/Lymphatic: Normal course and caliber of the major abdominal vessels. --No retroperitoneal lymphadenopathy. --No mesenteric lymphadenopathy. --No pelvic or  inguinal lymphadenopathy. Reproductive: Unremarkable Other: No ascites or free air. The abdominal wall is normal. Musculoskeletal. There is an acute compression fracture involving the superior endplate of the L1 vertebral body with approximately 15% height loss anteriorly. There is no retropulsion. There are 5 lumbar type vertebral bodies. Advanced degenerative changes are noted at the L5-S1 level where there is moderate disc height loss and developing facet arthrosis. There is a unilateral pars defect at L4 without significant anterolisthesis. There is a broad-based posterior disc bulge at the L4-L5 level without significant spinal canal stenosis. There is a broad-based posterior disc bulge at the L5-S1 level resulting in mild spinal canal narrowing. IMPRESSION: 1. Acute compression fracture involving the superior endplate of the L1 vertebral body with approximately 15% height loss anteriorly, without retropulsion. 2. No acute thoracic spine fracture. 3. No acute abnormalities of the cervical spine. 4. No acute intracranial abnormality. 5. No solid organ injury within the chest, abdomen, or pelvis. 6. Nonobstructing left-sided nephrolithiasis. 7. Pulmonary nodules, measuring up to 5 mm. No follow-up needed if patient is low risk. Non-contrast chest CT can be considered in 12 months if patient is high risk. This recommendation follows the consensus statement: Guidelines for Management of Incidental Pulmonary Nodules Detected on CT Images: From the Fleischner Society 2017; Radiology 2017; 284:228-243. Electronically Signed   By: Harrell Gave  Green M.D.   On: 08/27/2019 00:53   CT L-SPINE NO CHARGE  Result Date: 08/27/2019 CLINICAL DATA:  Acute pain due to trauma. Neck and back pain status post jumping off a roof onto a trampoline EXAM: CT HEAD WITHOUT CONTRAST CT CERVICAL SPINE WITHOUT CONTRAST CT CHEST, ABDOMEN AND PELVIS WITH CONTRAST CT THORACIC AND LUMBAR SPINE WITHOUT CONTRAST TECHNIQUE: Contiguous axial  images were obtained from the base of the skull through the vertex without intravenous contrast. Multidetector CT imaging of the cervical spine was performed without intravenous contrast. Multiplanar CT image reconstructions were also generated. Multidetector CT imaging of the chest, abdomen and pelvis was performed following the standard protocol during bolus administration of intravenous contrast. Multiplanar CT images of the thoracic and lumbar spine were reconstructed from contemporary CT of the Chest, Abdomen, and Pelvis CONTRAST:  100mL OMNIPAQUE IOHEXOL 300 MG/ML  SOLN COMPARISON:  None. FINDINGS: CT HEAD FINDINGS Brain: No evidence of acute infarction, hemorrhage, hydrocephalus, extra-axial collection or mass lesion/mass effect. Vascular: No hyperdense vessel or unexpected calcification. Skull: Normal. Negative for fracture or focal lesion. Sinuses/Orbits: There are mucosal retention cysts within the bilateral maxillary sinuses, right greater than left. The remaining paranasal sinuses and mastoid air cells are essentially clear. Other: None. CT CERVICAL FINDINGS Alignment: Normal. Skull base and vertebrae: No acute fracture. No primary bone lesion or focal pathologic process. Soft tissues and spinal canal: No prevertebral fluid or swelling. No visible canal hematoma. Disc levels: Minimal disc height loss is noted the lower cervical segments. Other:  None. CT CHEST FINDINGS Cardiovascular: The heart size is normal. There is no significant pericardial effusion. No evidence for thoracic aortic aneurysm or dissection. No evidence for large centrally located pulmonary embolism. Mediastinum/Nodes: --No mediastinal or hilar lymphadenopathy. --No axillary lymphadenopathy. --No supraclavicular lymphadenopathy. --Normal thyroid gland. --The esophagus is unremarkable Lungs/Pleura: There is a 5 mm pulmonary nodule in the right lower lobe (axial series 5, image 94). There are additional scattered bilateral pulmonary  nodules measure less than 5 mm. There is no pneumothorax or large pleural effusion. No focal infiltrate. Musculoskeletal: No chest wall abnormality. No acute or significant osseous findings. CT ABDOMEN PELVIS FINDINGS Hepatobiliary: The liver is normal. Normal gallbladder.There is no biliary ductal dilation. Pancreas: Normal contours without ductal dilatation. No peripancreatic fluid collection. Spleen: Unremarkable. Adrenals/Urinary Tract: --Adrenal glands: Unremarkable. --Right kidney/ureter: No hydronephrosis or radiopaque kidney stones. --Left kidney/ureter: There are nonobstructing stones in the lower pole the left kidney. --Urinary bladder: Unremarkable. Stomach/Bowel: --Stomach/Duodenum: No hiatal hernia or other gastric abnormality. Normal duodenal course and caliber. --Small bowel: Unremarkable. --Colon: Unremarkable. --Appendix: Normal. Vascular/Lymphatic: Normal course and caliber of the major abdominal vessels. --No retroperitoneal lymphadenopathy. --No mesenteric lymphadenopathy. --No pelvic or inguinal lymphadenopathy. Reproductive: Unremarkable Other: No ascites or free air. The abdominal wall is normal. Musculoskeletal. There is an acute compression fracture involving the superior endplate of the L1 vertebral body with approximately 15% height loss anteriorly. There is no retropulsion. There are 5 lumbar type vertebral bodies. Advanced degenerative changes are noted at the L5-S1 level where there is moderate disc height loss and developing facet arthrosis. There is a unilateral pars defect at L4 without significant anterolisthesis. There is a broad-based posterior disc bulge at the L4-L5 level without significant spinal canal stenosis. There is a broad-based posterior disc bulge at the L5-S1 level resulting in mild spinal canal narrowing. IMPRESSION: 1. Acute compression fracture involving the superior endplate of the L1 vertebral body with approximately 15% height loss anteriorly, without  retropulsion. 2. No  acute thoracic spine fracture. 3. No acute abnormalities of the cervical spine. 4. No acute intracranial abnormality. 5. No solid organ injury within the chest, abdomen, or pelvis. 6. Nonobstructing left-sided nephrolithiasis. 7. Pulmonary nodules, measuring up to 5 mm. No follow-up needed if patient is low risk. Non-contrast chest CT can be considered in 12 months if patient is high risk. This recommendation follows the consensus statement: Guidelines for Management of Incidental Pulmonary Nodules Detected on CT Images: From the Fleischner Society 2017; Radiology 2017; 284:228-243. Electronically Signed   By: Katherine Mantle M.D.   On: 08/27/2019 00:53   Procedures   ____________________________________________   INITIAL IMPRESSION / MDM / ASSESSMENT AND PLAN / ED COURSE  As part of my medical decision making, I reviewed the following data within the electronic MEDICAL RECORD NUMBER   39 year old male presented with above-stated history and physical exam differential diagnosis including musculoskeletal injury including compression fracture of the spinE, also concern for possible intra-abdominal organ injury as well as intrathoracic organ injury.  CT scans were performed which revealed a anterior superior endplate L1 compression fracture without retropulsion and no other acute findings.  Patient was given IV morphine 4 mg x 2 doses in the emergency department pain improvement.  Patient be prescribed Percocet at home.  I spoke with the patient at length regarding not driving or operating any equipment that would result in bodily harm if he were to become somnolen.  Patient is specifically told to not drive and take Percocet.  In addition patient will be referred to neurosurgery for further outpatient follow-up.      ____________________________________________  FINAL CLINICAL IMPRESSION(S) / ED DIAGNOSES  Final diagnoses:  Compression fracture of L1 vertebra, initial encounter  [facility])     MEDICATIONS GIVEN DURING THIS VISIT:  Medications  sodium chloride 0.9 % bolus 1,000 mL (1,000 mLs Intravenous New Bag/Given 08/27/19 0413)  iohexol (OMNIPAQUE) 300 MG/ML solution 100 mL (100 mLs Intravenous Contrast Given 08/26/19 2353)  morphine 4 MG/ML injection 4 mg (4 mg Intravenous Given 08/27/19 0413)  ondansetron (ZOFRAN) injection 4 mg (4 mg Intravenous Given 08/27/19 0413)     ED Discharge Orders    None      *Please note:  Jon Medina was evaluated in Emergency Department on 08/27/2019 for the symptoms described in the history of present illness. He was evaluated in the context of the global COVID-19 pandemic, which necessitated consideration that the patient might be at risk for infection with the SARS-CoV-2 virus that causes COVID-19. Institutional protocols and algorithms that pertain to the evaluation of patients at risk for COVID-19 are in a state of rapid change based on information released by regulatory bodies including the CDC and federal and state organizations. These policies and algorithms were followed during the patient's care in the ED.  Some ED evaluations and interventions may be delayed as a result of limited staffing during the pandemic.*  Note:  This document was prepared using Dragon voice recognition software and may include unintentional dictation errors.   Darci Current, MD 08/27/19 [id number]    Darci Current, MD 08/27/19 [phone]

## 2019-10-07 ENCOUNTER — Emergency Department
Admission: EM | Admit: 2019-10-07 | Discharge: 2019-10-07 | Disposition: A | Discharge status: Home / Self Care | Payer: Self-pay | Attending: Emergency Medicine | Admitting: Emergency Medicine

## 2019-10-07 ENCOUNTER — Other Ambulatory Visit: Payer: Self-pay

## 2019-10-07 ENCOUNTER — Emergency Department: Payer: Self-pay

## 2019-10-07 DIAGNOSIS — S32010D Wedge compression fracture of first lumbar vertebra, subsequent encounter for fracture with routine healing: Secondary | ICD-10-CM | POA: Insufficient documentation

## 2019-10-07 DIAGNOSIS — Z79899 Other long term (current) drug therapy: Secondary | ICD-10-CM | POA: Insufficient documentation

## 2019-10-07 DIAGNOSIS — F17200 Nicotine dependence, unspecified, uncomplicated: Secondary | ICD-10-CM | POA: Insufficient documentation

## 2019-10-07 DIAGNOSIS — R3912 Poor urinary stream: Secondary | ICD-10-CM | POA: Insufficient documentation

## 2019-10-07 DIAGNOSIS — X58XXXD Exposure to other specified factors, subsequent encounter: Secondary | ICD-10-CM | POA: Insufficient documentation

## 2019-10-07 LAB — URINALYSIS, COMPLETE (UACMP) WITH MICROSCOPIC
Bacteria, UA: NONE SEEN
Bilirubin Urine: NEGATIVE
Glucose, UA: NEGATIVE mg/dL
Hgb urine dipstick: NEGATIVE
Ketones, ur: NEGATIVE mg/dL
Leukocytes,Ua: NEGATIVE
Nitrite: NEGATIVE
Protein, ur: NEGATIVE mg/dL
Specific Gravity, Urine: 1.023 (ref 1.005–1.030)
pH: 7 (ref 5.0–8.0)

## 2019-10-07 LAB — CBC
HCT: 42.9 % (ref 39.0–52.0)
Hemoglobin: 15.1 g/dL (ref 13.0–17.0)
MCH: 31.3 pg (ref 26.0–34.0)
MCHC: 35.2 g/dL (ref 30.0–36.0)
MCV: 88.8 fL (ref 80.0–100.0)
Platelets: 333 10*3/uL (ref 150–400)
RBC: 4.83 MIL/uL (ref 4.22–5.81)
RDW: 11.9 % (ref 11.5–15.5)
WBC: 12 10*3/uL — ABNORMAL HIGH (ref 4.0–10.5)
nRBC: 0 % (ref 0.0–0.2)

## 2019-10-07 LAB — COMPREHENSIVE METABOLIC PANEL
ALT: 20 U/L (ref 0–44)
AST: 20 U/L (ref 15–41)
Albumin: 4.3 g/dL (ref 3.5–5.0)
Alkaline Phosphatase: 71 U/L (ref 38–126)
Anion gap: 10 (ref 5–15)
BUN: 21 mg/dL — ABNORMAL HIGH (ref 6–20)
CO2: 24 mmol/L (ref 22–32)
Calcium: 9.7 mg/dL (ref 8.9–10.3)
Chloride: 104 mmol/L (ref 98–111)
Creatinine, Ser: 0.82 mg/dL (ref 0.61–1.24)
GFR calc Af Amer: >60 mL/min (ref >60)
GFR calc non Af Amer: >60 mL/min (ref >60)
Glucose, Bld: 103 mg/dL — ABNORMAL HIGH (ref 70–99)
Potassium: 3.9 mmol/L (ref 3.5–5.1)
Sodium: 138 mmol/L (ref 135–145)
Total Bilirubin: 0.6 mg/dL (ref 0.3–1.2)
Total Protein: 8.3 g/dL — ABNORMAL HIGH (ref 6.5–8.1)

## 2019-10-07 LAB — LIPASE, BLOOD: Lipase: 32 U/L (ref 11–51)

## 2019-10-07 MED ORDER — MORPHINE SULFATE (PF) 4 MG/ML IV SOLN
4.0000 mg | Freq: Once | INTRAVENOUS | Status: AC
  Administered 2019-10-07: 4 mg via INTRAVENOUS
  Filled 2019-10-07: qty 1

## 2019-10-07 MED ORDER — ONDANSETRON HCL 4 MG/2ML IJ SOLN
4.0000 mg | Freq: Once | INTRAMUSCULAR | Status: AC
  Administered 2019-10-07: 4 mg via INTRAVENOUS
  Filled 2019-10-07: qty 2

## 2019-10-07 MED ORDER — DOCUSATE SODIUM 100 MG PO CAPS
100.0000 mg | ORAL_CAPSULE | Freq: Two times a day (BID) | ORAL | 0 refills | Status: AC
Start: 2019-10-07 — End: 2019-11-06

## 2019-10-07 MED ORDER — OXYCODONE-ACETAMINOPHEN 5-325 MG PO TABS: 1.0000 | ORAL_TABLET | Freq: Four times a day (QID) | ORAL | 0 refills | Status: AC | PRN

## 2019-10-07 NOTE — Discharge Instructions (Signed)
Thank you for letting us take care of you in the emergency department today.   New medications we have prescribed:  Pain medication Constipation medication   Please follow up with: [facility], information is below  Please return to the ER for any new or worsening symptoms.

## 2019-10-07 NOTE — ED Triage Notes (Signed)
Patient c/o lower back pain X 1 week. Patient reports crushing L1 vertebrae over a month ago; patient dx with bulging discs at time. Patient reports back pain radiates to abdomen. Patient reports increased urination, with difficulty starting urinary stream. Patient c/o constipation X 2 days. Patient reports hx of spinal infection last October.

## 2019-10-07 NOTE — ED Provider Notes (Signed)
Assumed care from Dr. Manson Passey at 7:00 AM.  Plan to follow-up MRI.  MRI lumbar spine reveals subacute L1 superior endplate fracture, height loss still mild at 30%, mildly progressed from before. No retropulsion.  No neural impingement.  Updated patient on results.  Will plan for discharge with Rx for pain control and constipation treatment, and plans for follow-up with spine.  Patient voices understanding of the plan and is comfortable with discharge.  Given return precautions.    Jon Medina., MD 10/07/19 [phone]

## 2019-10-07 NOTE — ED Notes (Signed)
Sat in MRI during procedure. Transported back to room 18.

## 2019-10-07 NOTE — ED Provider Notes (Signed)
[facility] Emergency Department Provider Note  ____________________________________________   First MD Initiated Contact with Patient 10/07/19 [phone]     (approximate)  I have reviewed the triage vital signs and the nursing notes.   HISTORY  Chief Complaint Back Pain    HPI Jon Medina is a 39 y.o. male with below list of previous medical additions including L1 compression fracture on 08/27/2019 and spinal infection in October 2020 presents to the emergency department secondary to worsening low back pain with radiation down posterior legs over the past week with current pain score of 10 out of 10.  Patient also admits to difficulty starting urination.  Patient also admits to constipation x2 days.  Patient denies any lower extremity weakness.        Past Medical History:  Diagnosis Date  . Migraine     There are no problems to display for this patient.   Past Surgical History:  Procedure Laterality Date  . CARPAL TUNNEL RELEASE    . endoscopy    . TONSILLECTOMY      Prior to Admission medications   Medication Sig Start Date End Date Taking? Authorizing Provider  acetaminophen (TYLENOL) 325 MG tablet Take 650 mg by mouth every 6 (six) hours as needed for mild pain, moderate pain, fever or headache.    [provider]  aspirin-sod bicarb-citric acid (ALKA-SELTZER) 325 MG TBEF tablet Take 325 mg by mouth every 6 (six) hours as needed.    [provider]  buprenorphine-naloxone (SUBOXONE) 8-2 mg SUBL SL tablet Place 1 tablet under the tongue daily. 02/22/19   [provider]  oxyCODONE-acetaminophen (PERCOCET) 5-325 MG tablet Take 1 tablet by mouth every 4 (four) hours as needed. 08/27/19 08/26/20  Darci Current, MD    Allergies Patient has no known allergies.  No family history on file.  Social History Social History   Tobacco Use  . Smoking status: Current Every Day Smoker  . Smokeless tobacco: Never Used    Substance Use Topics  . Alcohol use: Not Currently  . Drug use: Not on file    Review of Systems Constitutional: No fever/chills Eyes: No visual changes. ENT: No sore throat. Cardiovascular: Denies chest pain. Respiratory: Denies shortness of breath. Gastrointestinal: No abdominal pain.  No nausea, no vomiting.  No diarrhea.  No constipation. Genitourinary: Negative for dysuria. Musculoskeletal: Negative for neck pain.  Positive for back pain. Integumentary: Negative for rash. Neurological: Negative for headaches, focal weakness or numbness.  ____________________________________________   PHYSICAL EXAM:  VITAL SIGNS: ED Triage Vitals [10/07/19 0304]  Enc Vitals Group     BP (!) 140/103     Pulse Rate 83     Resp 18     Temp 98.5 F (36.9 C)     Temp src      SpO2 100 %     Weight 83.9 kg (184 lb 15.5 oz)     Height 1.854 m (6\' 1" )     Head Circumference      Peak Flow      Pain Score 10     Pain Loc      Pain Edu?      Excl. in GC?     Constitutional: Alert and oriented.  Apparent discomfort Eyes: Conjunctivae are normal.  Mouth/Throat: Patient is wearing a mask. Neck: No stridor.  No meningeal signs.   Cardiovascular: Normal rate, regular rhythm. Good peripheral circulation. Grossly normal heart sounds. Respiratory: Normal respiratory effort.  No retractions.  Gastrointestinal: Soft and nontender. No distention.  Musculoskeletal: No lower extremity tenderness nor edema. No gross deformities of extremities.  5 out of 5 bilateral lower extremity muscle strength. Neurologic:  Normal speech and language. No gross focal neurologic deficits are appreciated.  Skin:  Skin is warm, dry and intact. Psychiatric: Mood and affect are normal. Speech and behavior are normal.  ____________________________________________   LABS (all labs ordered are listed, but only abnormal results are displayed)  Labs Reviewed  COMPREHENSIVE METABOLIC PANEL - Abnormal; Notable for the  following components:      Result Value   Glucose, Bld 103 (*)    BUN 21 (*)    Total Protein 8.3 (*)    All other components within normal limits  CBC - Abnormal; Notable for the following components:   WBC 12.0 (*)    All other components within normal limits  URINALYSIS, COMPLETE (UACMP) WITH MICROSCOPIC - Abnormal; Notable for the following components:   Color, Urine YELLOW (*)    APPearance CLEAR (*)    All other components within normal limits  LIPASE, BLOOD     Procedures   ____________________________________________   INITIAL IMPRESSION / MDM / ASSESSMENT AND PLAN / ED COURSE  As part of my medical decision making, I reviewed the following data within the electronic MEDICAL RECORD NUMBER  39 year old male presented with above-stated history and physical exam a differential diagnosis including but not limited to lumbar radiculopathy, cauda equina syndrome, epidural abscess/osteomyelitis of the spine, discitis.  As such MRI of the lumbar spine ordered and is currently pending.  Patient given IV morphine 4 mg in the emergency department.  Patient's care transferred to Dr. Joan Mayans  ____________________________________________  FINAL CLINICAL IMPRESSION(S) / ED DIAGNOSES  Final diagnoses:  Compression fracture of L1 vertebra with routine healing, subsequent encounter     MEDICATIONS GIVEN DURING THIS VISIT:  Medications  morphine 4 MG/ML injection 4 mg (4 mg Intravenous Given 10/07/19 0600)  ondansetron (ZOFRAN) injection 4 mg (4 mg Intravenous Given 10/07/19 0559)     ED Discharge Orders    None      *Please note:  [name] was evaluated in Emergency Department on 10/07/2019 for the symptoms described in the history of present illness. He was evaluated in the context of the global COVID-19 pandemic, which necessitated consideration that the patient might be at risk for infection with the SARS-CoV-2 virus that causes COVID-19. Institutional protocols and algorithms  that pertain to the evaluation of patients at risk for COVID-19 are in a state of rapid change based on information released by regulatory bodies including the CDC and federal and state organizations. These policies and algorithms were followed during the patient's care in the ED.  Some ED evaluations and interventions may be delayed as a result of limited staffing during the pandemic.*  Note:  This document was prepared using Dragon voice recognition software and may include unintentional dictation errors.   Gregor Hams, MD 10/07/19 2241

## 2019-10-14 ENCOUNTER — Other Ambulatory Visit: Payer: Self-pay | Admitting: Nurse Practitioner

## 2019-10-15 ENCOUNTER — Other Ambulatory Visit: Payer: Self-pay | Admitting: Nurse Practitioner

## 2019-10-15 DIAGNOSIS — Z8739 Personal history of other diseases of the musculoskeletal system and connective tissue: Secondary | ICD-10-CM

## 2019-10-15 DIAGNOSIS — Z8781 Personal history of (healed) traumatic fracture: Secondary | ICD-10-CM

## 2019-10-16 ENCOUNTER — Telehealth: Payer: Self-pay | Admitting: Nurse Practitioner

## 2019-10-16 NOTE — Telephone Encounter (Signed)
10/16/19~ Hm # NIS. [phone] on order NIS. s/w [facility]/ref office ref non working # for patient/was given same Non working # as order. MF

## 2019-10-17 ENCOUNTER — Other Ambulatory Visit: Payer: Self-pay

## 2019-10-17 ENCOUNTER — Ambulatory Visit (INDEPENDENT_AMBULATORY_CARE_PROVIDER_SITE_OTHER): Payer: Self-pay | Admitting: Internal Medicine

## 2019-10-17 DIAGNOSIS — F1990 Other psychoactive substance use, unspecified, uncomplicated: Secondary | ICD-10-CM | POA: Insufficient documentation

## 2019-10-17 DIAGNOSIS — R590 Localized enlarged lymph nodes: Secondary | ICD-10-CM

## 2019-10-17 DIAGNOSIS — S32000A Wedge compression fracture of unspecified lumbar vertebra, initial encounter for closed fracture: Secondary | ICD-10-CM | POA: Insufficient documentation

## 2019-10-17 DIAGNOSIS — M4646 Discitis, unspecified, lumbar region: Secondary | ICD-10-CM

## 2019-10-17 DIAGNOSIS — F1910 Other psychoactive substance abuse, uncomplicated: Secondary | ICD-10-CM | POA: Insufficient documentation

## 2019-10-17 DIAGNOSIS — Y9344 Activity, trampolining: Secondary | ICD-10-CM

## 2019-10-17 NOTE — Progress Notes (Signed)
[facility]  Reason for Consult: Persistent lumbar infection Referring Provider: Patsey Berthold NP  Assessment: It is not clear to me whether or not his recent low back pain is simply due to his lumbar fracture or due to his fracture and persistent discitis.  His C-reactive protein was quite elevated the last September.  I will repeat inflammatory markers today and see him back after his contrasted MRI in 2 weeks.  Plan: 1. Check sed rate and C-reactive protein 2. Follow-up in 2 weeks  Patient Active Problem List   Diagnosis Date Noted  . Lumbar discitis 10/17/2019    Priority: High  . Lumbar compression fracture (HCC) 10/17/2019  . Polysubstance abuse (HCC) 10/17/2019  . IVDU (intravenous drug user) 10/17/2019    Patient's Medications  New Prescriptions   No medications on file  Previous Medications   ACETAMINOPHEN (TYLENOL) 325 MG TABLET    Take 650 mg by mouth every 6 (six) hours as needed for mild pain, moderate pain, fever or headache.   ASPIRIN-SOD BICARB-CITRIC ACID (ALKA-SELTZER) 325 MG TBEF TABLET    Take 325 mg by mouth every 6 (six) hours as needed.   BUPRENORPHINE-NALOXONE (SUBOXONE) 8-2 MG SUBL SL TABLET    Place 1 tablet under the tongue daily.   DOCUSATE SODIUM (COLACE) 100 MG CAPSULE    Take 1 capsule (100 mg total) by mouth 2 (two) times daily.   OXYCODONE-ACETAMINOPHEN (PERCOCET) 5-325 MG TABLET    Take 1 tablet by mouth every 4 (four) hours as needed.  Modified Medications   No medications on file  Discontinued Medications   No medications on file    HPI: Jon Medina is a 39 y.o. male who was admitted to [facility] on 01/27/2019 because of a several week history of severe lower back pain.  MRI scan showed evidence of L5-S1 discitis.  No lumbar aspirate was performed.  No blood cultures were performed.  He underwent a lumbar puncture which was negative and a TEE which did not show any evidence of endocarditis.   Notes indicate that he has a history of polysubstance abuse and intravenous drug use.  His admission urine drug screen was positive for amphetamines.  He was treated with IV vancomycin and ceftriaxone and improved.  While hospitalized he developed prominent cervical adenopathy.  Monospot test was positive but the Epstein-Barr virus viral capsid antigen IgM was negative.  Routine and AFB cultures of the lymph node were negative.  Testing for HIV, hepatitis B and C was negative.  He was discharged on 02/19/2019 with oral ciprofloxacin and clindamycin.  He thinks he was supposed to take 6 weeks of oral antibiotic therapy but stopped after 4 weeks.  He was feeling much better.  About 2 months ago he was playing with his children and jumped off a trampoline.  He had sudden onset of lower back pain again and was found to have an L1 compression fracture.  He underwent a repeat MRI on 10/07/2019 without contrast.  It showed a compression fracture and made no comment about persistent inflammation at L5-S1.  It appears that he is going to be scheduled to have a contrasted lumbar MRI soon.  Review of Systems: Review of Systems  Constitutional: Positive for chills. Negative for diaphoresis and fever.  Gastrointestinal: Negative for abdominal pain, diarrhea, nausea and vomiting.  Musculoskeletal: Positive for back pain.  Psychiatric/Behavioral: Positive for substance abuse.      Past Medical History:  Diagnosis Date  . Migraine     Social History   Tobacco Use  . Smoking status: Current Every Day Smoker  . Smokeless tobacco: Never Used  Substance Use Topics  . Alcohol use: Not Currently  . Drug use: Not on file    No family history on file. No Known Allergies  OBJECTIVE: There were no vitals filed for this visit. There is no height or weight on file to calculate BMI.   Physical Exam Constitutional:      Comments: He is pleasant and in no distress.  He is accompanied by his girlfriend.  Neck:       Comments: He has no cervical adenopathy. Cardiovascular:     Rate and Rhythm: Normal rate and regular rhythm.     Heart sounds: No murmur.  Pulmonary:     Effort: Pulmonary effort is normal.     Breath sounds: Normal breath sounds.  Musculoskeletal:     Comments: He has mild tenderness over his lower spine but no bony abnormality.  Skin:    Findings: No rash.  Neurological:     General: No focal deficit present.     Microbiology: No results found for this or any previous visit (from the past 240 hour(s)).  Michel Bickers, MD [facility] [place] [phone] pager   [phone] cell 10/17/2019, 2:28 PM

## 2019-10-18 LAB — C-REACTIVE PROTEIN: CRP: 39 mg/L — ABNORMAL HIGH (ref <8.0)

## 2019-10-18 LAB — SEDIMENTATION RATE: Sed Rate: 22 mm/h — ABNORMAL HIGH (ref 0–15)

## 2019-10-23 MED ORDER — NICOTINE 21 MG/24HR TD PT24
1.00 | MEDICATED_PATCH | TRANSDERMAL | Status: DC
Start: ? — End: 2019-10-23

## 2019-10-23 MED ORDER — MORPHINE SULFATE 4 MG/ML IJ SOLN
4.00 | INTRAMUSCULAR | Status: DC
Start: ? — End: 2019-10-23

## 2019-10-29 ENCOUNTER — Ambulatory Visit: Payer: Medicaid Other | Admitting: Internal Medicine

## 2019-10-31 ENCOUNTER — Ambulatory Visit: Admission: RE | Admit: 2019-10-31 | Payer: Self-pay | Source: Ambulatory Visit

## 2020-01-03 ENCOUNTER — Emergency Department: Payer: Self-pay

## 2020-01-03 ENCOUNTER — Emergency Department
Admission: EM | Admit: 2020-01-03 | Discharge: 2020-01-03 | Disposition: A | Discharge status: Home / Self Care | Payer: Self-pay | Attending: Emergency Medicine | Admitting: Emergency Medicine

## 2020-01-03 ENCOUNTER — Encounter: Payer: Self-pay | Admitting: Emergency Medicine

## 2020-01-03 ENCOUNTER — Other Ambulatory Visit: Payer: Self-pay

## 2020-01-03 DIAGNOSIS — K047 Periapical abscess without sinus: Secondary | ICD-10-CM | POA: Insufficient documentation

## 2020-01-03 DIAGNOSIS — F17200 Nicotine dependence, unspecified, uncomplicated: Secondary | ICD-10-CM | POA: Insufficient documentation

## 2020-01-03 LAB — CBC WITH DIFFERENTIAL/PLATELET
Abs Immature Granulocytes: 0.05 10*3/uL (ref 0.00–0.07)
Basophils Absolute: 0 10*3/uL (ref 0.0–0.1)
Basophils Relative: 0 %
Eosinophils Absolute: 0 10*3/uL (ref 0.0–0.5)
Eosinophils Relative: 0 %
HCT: 41.9 % (ref 39.0–52.0)
Hemoglobin: 14.7 g/dL (ref 13.0–17.0)
Immature Granulocytes: 0 %
Lymphocytes Relative: 14 %
Lymphs Abs: 1.8 10*3/uL (ref 0.7–4.0)
MCH: 30.4 pg (ref 26.0–34.0)
MCHC: 35.1 g/dL (ref 30.0–36.0)
MCV: 86.6 fL (ref 80.0–100.0)
Monocytes Absolute: 1.3 10*3/uL — ABNORMAL HIGH (ref 0.1–1.0)
Monocytes Relative: 10 %
Neutro Abs: 9.2 10*3/uL — ABNORMAL HIGH (ref 1.7–7.7)
Neutrophils Relative %: 76 %
Platelets: 319 10*3/uL (ref 150–400)
RBC: 4.84 MIL/uL (ref 4.22–5.81)
RDW: 12.4 % (ref 11.5–15.5)
WBC: 12.3 10*3/uL — ABNORMAL HIGH (ref 4.0–10.5)
nRBC: 0 % (ref 0.0–0.2)

## 2020-01-03 LAB — BASIC METABOLIC PANEL
Anion gap: 10 (ref 5–15)
BUN: 17 mg/dL (ref 6–20)
CO2: 26 mmol/L (ref 22–32)
Calcium: 9.8 mg/dL (ref 8.9–10.3)
Chloride: 103 mmol/L (ref 98–111)
Creatinine, Ser: 0.68 mg/dL (ref 0.61–1.24)
GFR calc Af Amer: >60 mL/min (ref >60)
GFR calc non Af Amer: >60 mL/min (ref >60)
Glucose, Bld: 125 mg/dL — ABNORMAL HIGH (ref 70–99)
Potassium: 3.7 mmol/L (ref 3.5–5.1)
Sodium: 139 mmol/L (ref 135–145)

## 2020-01-03 LAB — LACTIC ACID, PLASMA: Lactic Acid, Venous: 0.9 mmol/L (ref 0.5–1.9)

## 2020-01-03 MED ORDER — LIDOCAINE-EPINEPHRINE 1 %-1:100000 IJ SOLN
10.0000 mL | Freq: Once | INTRAMUSCULAR | Status: AC
  Administered 2020-01-03: 10 mL via INTRADERMAL
  Filled 2020-01-03: qty 10

## 2020-01-03 MED ORDER — CLINDAMYCIN PHOSPHATE 600 MG/50ML IV SOLN
600.0000 mg | Freq: Once | INTRAVENOUS | Status: AC
  Administered 2020-01-03: 600 mg via INTRAVENOUS
  Filled 2020-01-03: qty 50

## 2020-01-03 MED ORDER — IOHEXOL 300 MG/ML  SOLN
75.0000 mL | Freq: Once | INTRAMUSCULAR | Status: AC | PRN
  Administered 2020-01-03: 75 mL via INTRAVENOUS

## 2020-01-03 MED ORDER — ACETAMINOPHEN 500 MG PO TABS
1000.0000 mg | ORAL_TABLET | Freq: Once | ORAL | Status: AC
  Administered 2020-01-03: 1000 mg via ORAL
  Filled 2020-01-03: qty 2

## 2020-01-03 MED ORDER — CLINDAMYCIN HCL 300 MG PO CAPS
300.0000 mg | ORAL_CAPSULE | Freq: Three times a day (TID) | ORAL | 0 refills | Status: AC
Start: 2020-01-03 — End: 2020-01-13

## 2020-01-03 MED ORDER — KETOROLAC TROMETHAMINE 30 MG/ML IJ SOLN
15.0000 mg | Freq: Once | INTRAMUSCULAR | Status: AC
  Administered 2020-01-03: 15 mg via INTRAVENOUS
  Filled 2020-01-03: qty 1

## 2020-01-03 MED ORDER — DEXAMETHASONE SODIUM PHOSPHATE 10 MG/ML IJ SOLN
10.0000 mg | Freq: Once | INTRAMUSCULAR | Status: AC
  Administered 2020-01-03: 10 mg via INTRAVENOUS
  Filled 2020-01-03: qty 1

## 2020-01-03 MED ORDER — NAPROXEN 500 MG PO TABS
500.0000 mg | ORAL_TABLET | Freq: Once | ORAL | Status: AC
  Administered 2020-01-03: 500 mg via ORAL
  Filled 2020-01-03: qty 1

## 2020-01-03 MED ORDER — NAPROXEN 500 MG PO TABS: 500.0000 mg | ORAL_TABLET | Freq: Two times a day (BID) | ORAL | 2 refills | Status: AC

## 2020-01-03 MED ORDER — PREDNISONE 20 MG PO TABS: 60.0000 mg | ORAL_TABLET | Freq: Every day | ORAL | 0 refills | Status: AC

## 2020-01-03 NOTE — ED Triage Notes (Signed)
Pt to triage via w/c with no distress noted; pt st last 2 days having swelling to rt side mouth; st he had a tooth that got knocked out but it was several years ago--denies any recent dental pain; pt with large amount swelling to rt side cheek

## 2020-01-03 NOTE — ED Notes (Signed)
Pt in CT.

## 2020-01-03 NOTE — Discharge Instructions (Signed)
OPTIONS FOR DENTAL FOLLOW UP CARE ° °[place] Department of Health and Human Services - Local Safety Net Dental Clinics °http://www.ncdhhs.gov/dph/oralhealth/services/safetynetclinics.htm °  °[facility] (336-562-3123) ° °[facility] [place] (919-933-9087) ° °[facility] ([phone] ext [phone]) ° °[facility] ([phone]) ° °[facility] ([phone]) °This clinic caters to the indigent population and is on a lottery system. °Location: °[facility], [address], [place] °Clinic Hours: °Wednesdays from 6pm - 9pm, patients seen by a lottery system. °For dates, call or go to [email or web address].edu/[facility]/patients/Dental-[facility] °Services: °Cleanings, fillings and simple extractions. °Payment Options: °DENTAL WORK IS FREE OF CHARGE. Bring proof of income or support. °Best way to get seen: °Arrive at 5:15 pm - this is a lottery, NOT first come/first serve, so arriving earlier will not increase your chances of being seen. °  °  °[facility] °[phone] °Select option 1 for emergencies °  °Location: °[facility], [address], [place] °Clinic Hours: °No walk-ins accepted - call the day before to schedule an appointment. °Check in times are 9:30 am and 1:30 pm. °Services: °Simple extractions, temporary fillings, pulpectomy/pulp debridement, uncomplicated abscess drainage. °Payment Options: °PAYMENT IS DUE AT THE TIME OF SERVICE.  Fee is usually $100-200, additional surgical procedures (e.g. abscess drainage) may be extra. °Cash, checks, Visa/MasterCard accepted.  Can file Medicaid if patient is covered for dental - patient should call case worker to check. °No discount for [facility] patients. °Best way to get seen: °MUST call the day before and get onto the schedule. Can usually be seen the next 1-2 days. No walk-ins accepted. °  °  °[facility] °[phone] °   °Location: °[facility], [address], [place] °Clinic Hours: °M, W, Th, F 8am or 1:30pm, Tues 9a or 1:30 - first come/first served. °Services: °Simple extractions, temporary fillings, uncomplicated abscess drainage.  You do not need to be an [place] resident. °Payment Options: °PAYMENT IS DUE AT THE TIME OF SERVICE. °Dental insurance, otherwise sliding scale - bring proof of income or support. °Depending on income and treatment needed, cost is usually $50-200. °Best way to get seen: °Arrive early as it is first come/first served. °  °  °[facility] °[phone] °  °Location: °[address] °Clinic Hours: °Mon-Thu 8a-5p °Services: °Most basic dental services including extractions and fillings. °Payment Options: °PAYMENT IS DUE AT THE TIME OF SERVICE. °Sliding scale, up to 50% off - bring proof if income or support. °Medicaid with dental option accepted. °Best way to get seen: °Call to schedule an appointment, can usually be seen within 2 weeks OR they will try to see walk-ins - show up at 8a or 2p (you may have to wait). °  °  °[facility] °[phone] °[place] RESIDENTS ONLY °  °Location: °[facility], [address], [place], [place] [address] °Clinic Hours: By appointment only. °Monday - Thursday 8am-5pm, Friday 8am-12pm °Services: Cleanings, fillings, extractions. °Payment Options: °PAYMENT IS DUE AT THE TIME OF SERVICE. °Cash, Visa or MasterCard. Sliding scale - $30 minimum per service. °Best way to get seen: °Come in to office, complete packet and make an appointment - need proof of income °or support monies for each household member and proof of [place] residence. °Usually takes about a month to get in. °  °  °[facility] °[phone] °  °Location: °[address]   [place] °Clinic Hours: Walk-in Urgent Care Dental Services are offered Monday-Friday  mornings only. °The numbers of emergencies accepted daily is limited to the number of °providers available. °Maximum 15 - Mondays, Wednesdays & Thursdays °Maximum 10 - Tuesdays & Fridays °Services: °You do not need to be a [place] resident to be seen for a dental emergency. °Emergencies are defined as pain, swelling, abnormal bleeding, or dental trauma. Walkins will receive x-rays if needed. °NOTE: Dental cleaning is not an emergency. °Payment Options: °PAYMENT IS DUE AT THE TIME OF SERVICE. °Minimum co-pay is $40.00 for uninsured patients. °Minimum co-pay is $3.00 for Medicaid with dental coverage. °Dental Insurance is accepted and must be presented at time of visit. °Medicare does not cover dental. °Forms of payment: Cash, credit card, checks. °Best way to get seen: °If not previously registered with the clinic, walk-in dental registration begins at 7:15 am and is on a first come/first serve basis. °If previously registered with the clinic, call to make an appointment. °  °  °[facility] °[phone] °[place] RESIDENTS ONLY °  °Location: °[address], [place], [place] °Clinic Hours: °Mon-Thu 10a-2p °Services: Extractions only! °Payment Options: °FREE (donations accepted) - bring proof of income or support °Best way to get seen: °Call and schedule an appointment OR come at 8am on the 1st Monday of every month (except for holidays) when it is first come/first served. °  °  °[facility] °[phone] °  °Location: °[address],  °Clinic Hours: °Friday mornings °Services, Payment Options, Best way to get seen: °Call for info °

## 2020-01-03 NOTE — ED Provider Notes (Signed)
[facility] Emergency Department Provider Note  ____________________________________________  Time seen: Approximately 5:04 AM  I have reviewed the triage vital signs and the nursing notes.   HISTORY  Chief Complaint Abscess   HPI Jon Medina is a 39 y.o. male who presents for evaluation of facial swelling.  Patient reports a cavity on his right upper molar which he has had for a while.  Over the last 2 days has had progression of the pain and swelling of the right side of his face.  He is able to handle his secretion, has no trismus, no difficulty breathing, no visual changes, no headache, no fever or chills, no nausea or vomiting.  The pain is constant sharp and severe.   Past Medical History:  Diagnosis Date  . Migraine     Patient Active Problem List   Diagnosis Date Noted  . Lumbar discitis 10/17/2019  . Lumbar compression fracture (HCC) 10/17/2019  . Polysubstance abuse (HCC) 10/17/2019  . IVDU (intravenous drug user) 10/17/2019    Past Surgical History:  Procedure Laterality Date  . CARPAL TUNNEL RELEASE    . endoscopy    . TONSILLECTOMY      Prior to Admission medications   Medication Sig Start Date End Date Taking? Authorizing Provider  acetaminophen (TYLENOL) 325 MG tablet Take 650 mg by mouth every 6 (six) hours as needed for mild pain, moderate pain, fever or headache.    [provider]  aspirin-sod bicarb-citric acid (ALKA-SELTZER) 325 MG TBEF tablet Take 325 mg by mouth every 6 (six) hours as needed.    [provider]  buprenorphine-naloxone (SUBOXONE) 8-2 mg SUBL SL tablet Place 1 tablet under the tongue daily. 02/22/19   [provider]  clindamycin (CLEOCIN) 300 MG capsule Take 1 capsule (300 mg total) by mouth 3 (three) times daily for 10 days. 01/03/20 01/13/20  Nita SickleVeronese, [place], MD  naproxen (NAPROSYN) 500 MG tablet Take 1 tablet (500 mg total) by mouth 2 (two) times daily with a meal. 01/03/20  01/02/21  Don PerkingVeronese, [place], MD  oxyCODONE-acetaminophen (PERCOCET) 5-325 MG tablet Take 1 tablet by mouth every 4 (four) hours as needed. 08/27/19 08/26/20  Darci CurrentBrown, [place] N, MD  predniSONE (DELTASONE) 20 MG tablet Take 3 tablets (60 mg total) by mouth daily for 4 days. 01/03/20 01/07/20  Nita SickleVeronese, [place], MD    Allergies Patient has no known allergies.  No family history on file.  Social History Social History   Tobacco Use  . Smoking status: Current Every Day Smoker  . Smokeless tobacco: Never Used  Vaping Use  . Vaping Use: Never used  Substance Use Topics  . Alcohol use: Not Currently  . Drug use: Not on file    Review of Systems  Constitutional: Negative for fever. Eyes: Negative for visual changes. ENT: Negative for sore throat. + facial swelling Neck: No neck pain  Cardiovascular: Negative for chest pain. Respiratory: Negative for shortness of breath. Gastrointestinal: Negative for abdominal pain, vomiting or diarrhea. Genitourinary: Negative for dysuria. Musculoskeletal: Negative for back pain. Skin: Negative for rash. Neurological: Negative for headaches, weakness or numbness. Psych: No SI or HI  ____________________________________________   PHYSICAL EXAM:  VITAL SIGNS: ED Triage Vitals  Enc Vitals Group     BP 01/03/20 0430 128/79     Pulse Rate 01/03/20 0430 (!) 108     Resp 01/03/20 0430 18     Temp 01/03/20 0430 99.7 F (37.6 C)     Temp Source 01/03/20 0430 Oral  SpO2 01/03/20 0430 97 %     Weight 01/03/20 0450 190 lb (86.2 kg)     Height 01/03/20 0450 6\' 1"  (1.854 m)     Head Circumference --      Peak Flow --      Pain Score 01/03/20 0449 10     Pain Loc --      Pain Edu? --      Excl. in GC? --     Constitutional: Alert and oriented. Well appearing and in no apparent distress. HEENT:      Head: Normocephalic and atraumatic.         Face: There is significant swelling on the R face from upper lip to just below the eye, no erythema or  warmth. Several cavities and poor dentition      Eyes: Conjunctivae are normal. Sclera is non-icteric.       Mouth/Throat: Mucous membranes are moist. No trismus, floor of the mouth is soft with no induration. Airway is patent.      Neck: Supple with no signs of meningismus. Normal with no erythema or induration. Cardiovascular: Tachycardic with regular rhythm, no murmurs. Respiratory: Normal respiratory effort. Lungs are clear to auscultation bilaterally. No wheezes, crackles, or rhonchi.  Gastrointestinal: Soft, non tender. Musculoskeletal: No edema, cyanosis, or erythema of extremities. Neurologic: Normal speech and language. Face is symmetric. Moving all extremities. No gross focal neurologic deficits are appreciated. Skin: Skin is warm, dry and intact. No rash noted. Psychiatric: Mood and affect are normal. Speech and behavior are normal.  ____________________________________________   LABS (all labs ordered are listed, but only abnormal results are displayed)  Labs Reviewed  CBC WITH DIFFERENTIAL/PLATELET - Abnormal; Notable for the following components:      Result Value   WBC 12.3 (*)    Neutro Abs 9.2 (*)    Monocytes Absolute 1.3 (*)    All other components within normal limits  BASIC METABOLIC PANEL - Abnormal; Notable for the following components:   Glucose, Bld 125 (*)    All other components within normal limits  CULTURE, BLOOD (ROUTINE X 2)  CULTURE, BLOOD (ROUTINE X 2)  LACTIC ACID, PLASMA  CT consistent with subperiosteal abscess of tooth 7 which was drained per procedure note above.  Patient be discharged home ____________________________________________  EKG  none  ____________________________________________  RADIOLOGY  I have personally reviewed the images performed during this visit and I agree with the Radiologist's read.   Interpretation by Radiologist:  CT Maxillofacial W Contrast  Result Date: 01/03/2020 CLINICAL DATA:  Cellulitis of the face.  EXAM: CT MAXILLOFACIAL WITH CONTRAST TECHNIQUE: Multidetector CT imaging of the maxillofacial structures was performed with intravenous contrast. Multiplanar CT image reconstructions were also generated. CONTRAST:  87mL OMNIPAQUE IOHEXOL 300 MG/ML  SOLN COMPARISON:  CT head 08/27/2019 FINDINGS: Osseous: No acute fracture is present. Multiple dental caries are present. Periapical lucency present about teeth 7 and 8 extends through the cortex with associated subperiosteal abscess is the source of infection. No other significant periapical or subperiosteal abscess is present. Orbits: Soft tissue swelling extends to the inferior orbital rim on the right. Globes and orbits are otherwise within normal limits. Sinuses: The paranasal sinuses and mastoid air cells are clear. Soft tissues: Subperiosteal abscess centered at tooth 7 measures 7 x 25 mm. Extensive soft tissue swelling and edema is noted along the right nasal labial fold extending into the upper lip and to the inferior orbital rim. Limited intracranial: Within normal limits. IMPRESSION: 1. Subperiosteal  abscess centered at tooth 7 measures 7 x 25 mm. Prominent dental caries and periapical abscess are the source of infection. 2. Extensive soft tissue swelling and edema along the right nasal labial fold extending into the upper lip and to the inferior orbital rim. 3. Multiple dental caries. Electronically Signed   By: Marin Roberts M.D.   On: 01/03/2020 07:27      ____________________________________________   PROCEDURES  Procedure(s) performed:yes .1-3 Lead EKG Interpretation Performed by: Nita Sickle, MD Authorized by: Nita Sickle, MD     Interpretation: normal     ECG rate assessment: normal     Rhythm: sinus rhythm     Ectopy: none    .Marland KitchenIncision and Drainage  Date/Time: 01/03/2020 7:52 AM Performed by: Nita Sickle, MD Authorized by: Nita Sickle, MD   Consent:    Consent obtained:  Verbal   Consent  given by:  Patient   Risks discussed:  Bleeding, infection, incomplete drainage and pain   Alternatives discussed:  Alternative treatment, delayed treatment and observation Location:    Type:  Abscess   Location:  Mouth   Mouth location:  Alveolar process Anesthesia (see MAR for exact dosages):    Anesthesia method:  Local infiltration   Local anesthetic:  Lidocaine 2% WITH epi Procedure type:    Complexity:  Simple Procedure details:    Incision types:  Stab incision   Drainage:  Purulent   Drainage amount:  Moderate   Wound treatment:  Wound left open Post-procedure details:    Patient tolerance of procedure:  Tolerated well, no immediate complications   Critical Care performed:  None ____________________________________________   INITIAL IMPRESSION / ASSESSMENT AND PLAN / ED COURSE   39 y.o. male who presents for evaluation of facial swelling coming from a RU molar cavity. NO signs of Ludwig angina or airway compromise, no trismus. Afebrile but slightly tachycardic. Will get CT w/contrast to eval for abscess vs cellulitis. Will give IV toradol, IV decadron, and IV clindamycin. Labs and cultures pending. Old medical records reviewed.   _________________________ 7:52 AM on 01/03/2020 ----------------------------------------- CT consistent with subperiosteal abscess adjacent to tooth 7 which was drained per procedure note above.  Patient be discharged home on naproxen, prednisone, and clindamycin.  Referral to dentist.  Discussed my standard return precautions.     _____________________________________________ Please note:  Patient was evaluated in Emergency Department today for the symptoms described in the history of present illness. Patient was evaluated in the context of the global COVID-19 pandemic, which necessitated consideration that the patient might be at risk for infection with the SARS-CoV-2 virus that causes COVID-19. Institutional protocols and algorithms that  pertain to the evaluation of patients at risk for COVID-19 are in a state of rapid change based on information released by regulatory bodies including the CDC and federal and state organizations. These policies and algorithms were followed during the patient's care in the ED.  Some ED evaluations and interventions may be delayed as a result of limited staffing during the pandemic.    Controlled Substance Database was reviewed by me. ____________________________________________   FINAL CLINICAL IMPRESSION(S) / ED DIAGNOSES   Final diagnoses:  Dental abscess      NEW MEDICATIONS STARTED DURING THIS VISIT:  ED Discharge Orders         Ordered    clindamycin (CLEOCIN) 300 MG capsule  3 times daily     Discontinue  Reprint     01/03/20 0750    predniSONE (DELTASONE) 20 MG tablet  Daily     Discontinue  Reprint     01/03/20 0750    naproxen (NAPROSYN) 500 MG tablet  2 times daily with meals     Discontinue  Reprint     01/03/20 0750           Note:  This document was prepared using Dragon voice recognition software and may include unintentional dictation errors.    Don Perking, [place], MD 01/03/20 [id number]

## 2020-01-08 LAB — CULTURE, BLOOD (ROUTINE X 2)
Culture: NO GROWTH
Culture: NO GROWTH
Special Requests: ADEQUATE

## 2020-08-25 IMAGING — CT CT T SPINE W/O CM
2 of 3 series · 10 of 33 positions shown, 12 images · IV contrast (agent unspecified)
Comparison: None.

CLINICAL DATA: Acute pain due to trauma. Neck and back pain status
post jumping off a roof onto a trampoline

EXAM:
CT HEAD WITHOUT CONTRAST
CT CERVICAL SPINE WITHOUT CONTRAST
CT CHEST, ABDOMEN AND PELVIS WITH CONTRAST
CT THORACIC AND LUMBAR SPINE WITHOUT CONTRAST
TECHNIQUE: Contiguous axial images were obtained from the base of the skull
through the vertex without intravenous contrast.

[Series 1: t soft · axial · 0.30mm/px · z∈[-664,-360]mm · 7 of 180 slices shown, 9 images]
[im 14/180  soft-tissue]
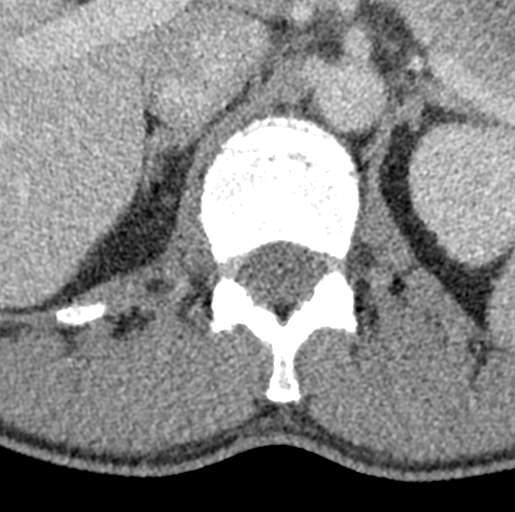
[im 14/180  bone]
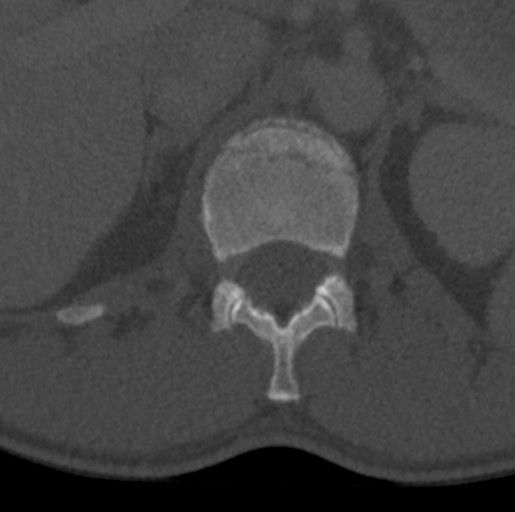
[im 42/180  bone]
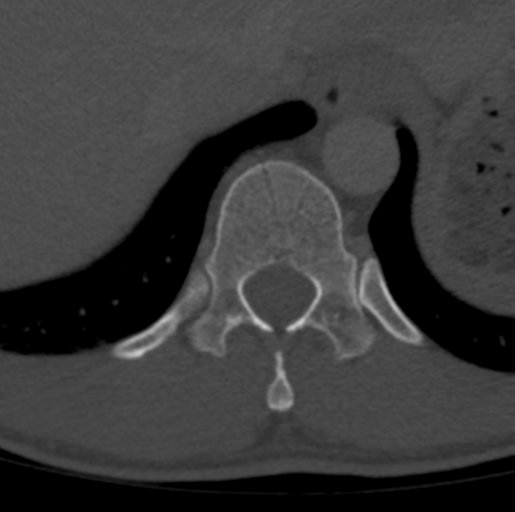
[im 69/180  bone]
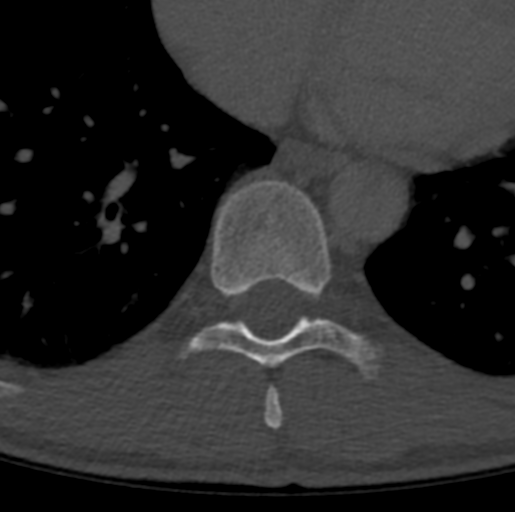
[im 97/180  bone]
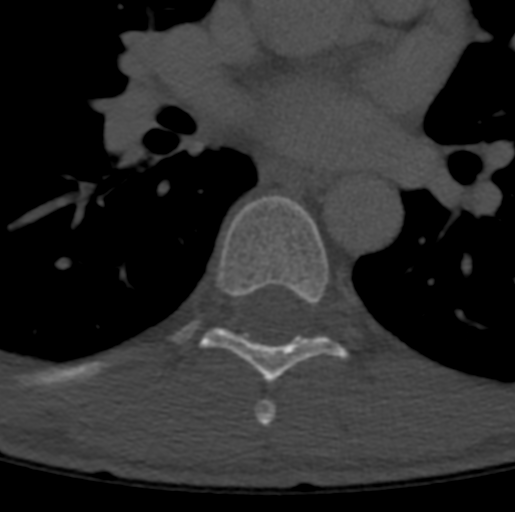
[im 111/180  soft-tissue]
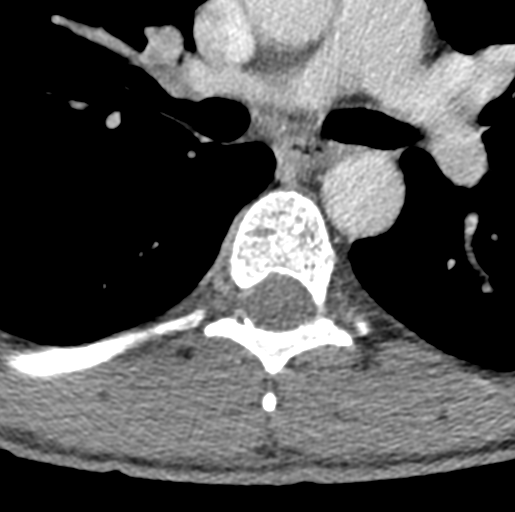
[im 111/180  bone]
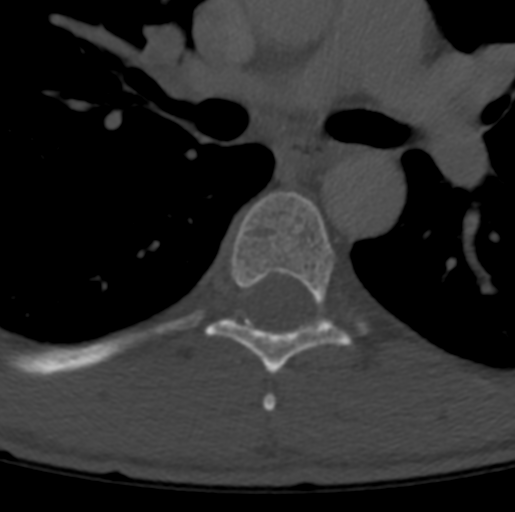
[im 138/180  bone]
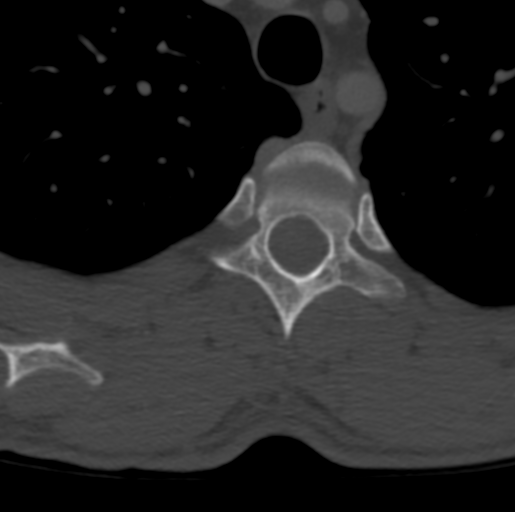
[im 166/180  bone]
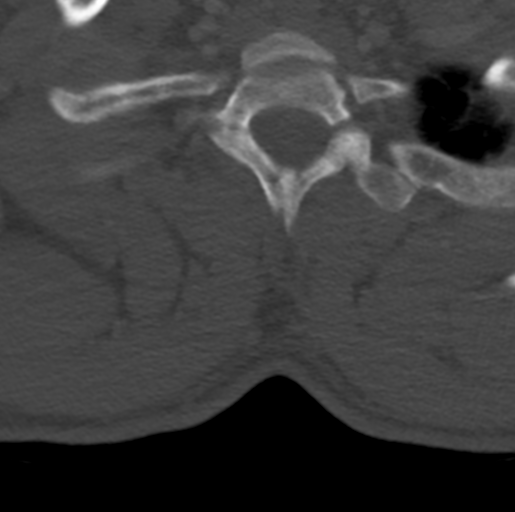

[Series 3: t bone cor · coronal · 0.29mm/px · 3 of 56 slices shown]
[im 12/56  bone]
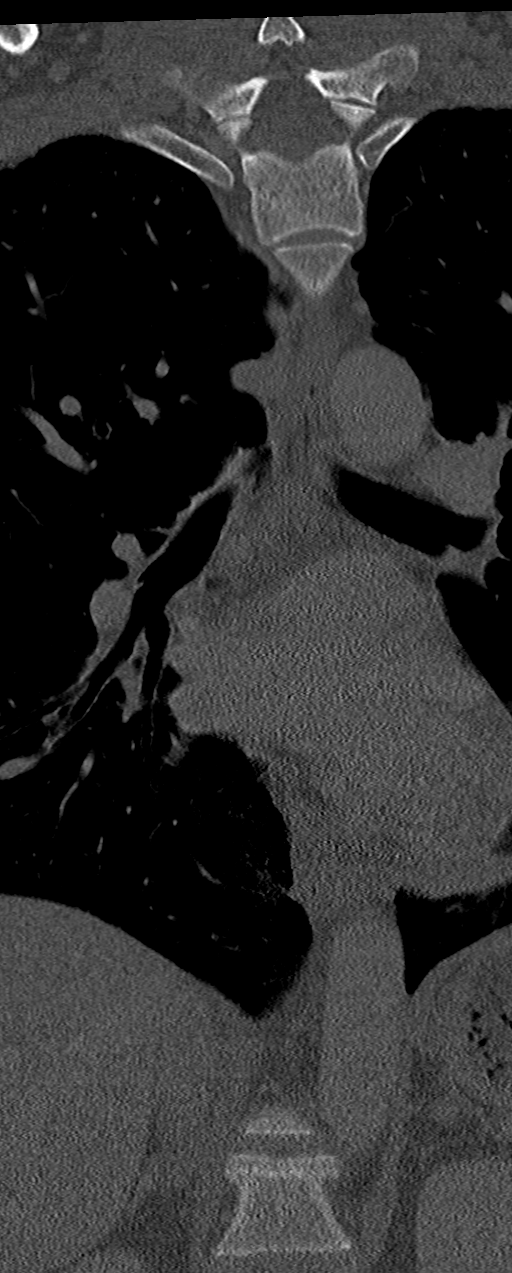
[im 23/56  bone]
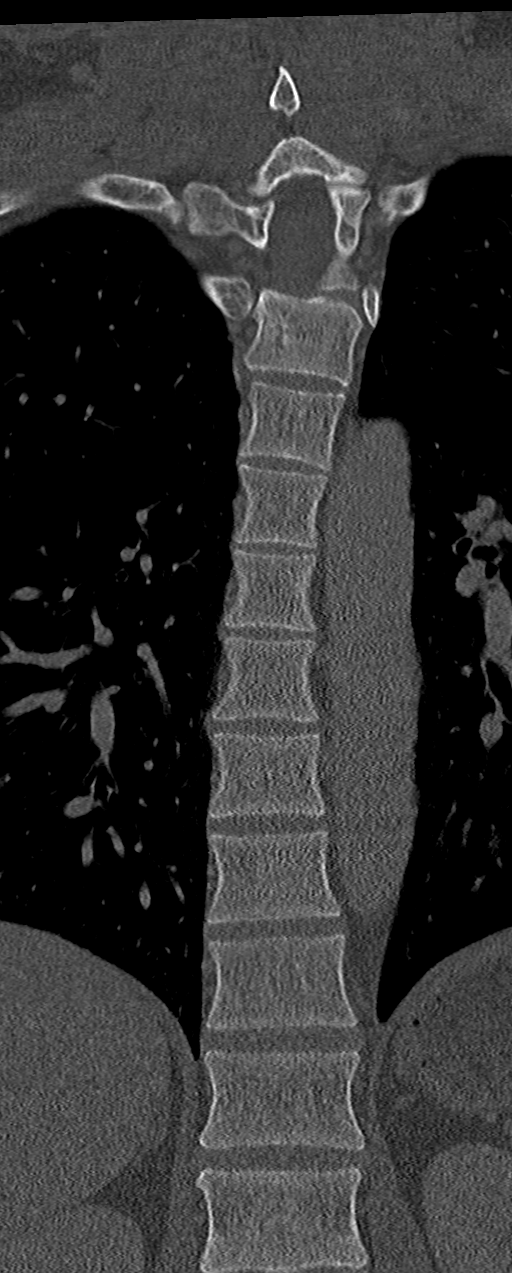
[im 34/56  bone]
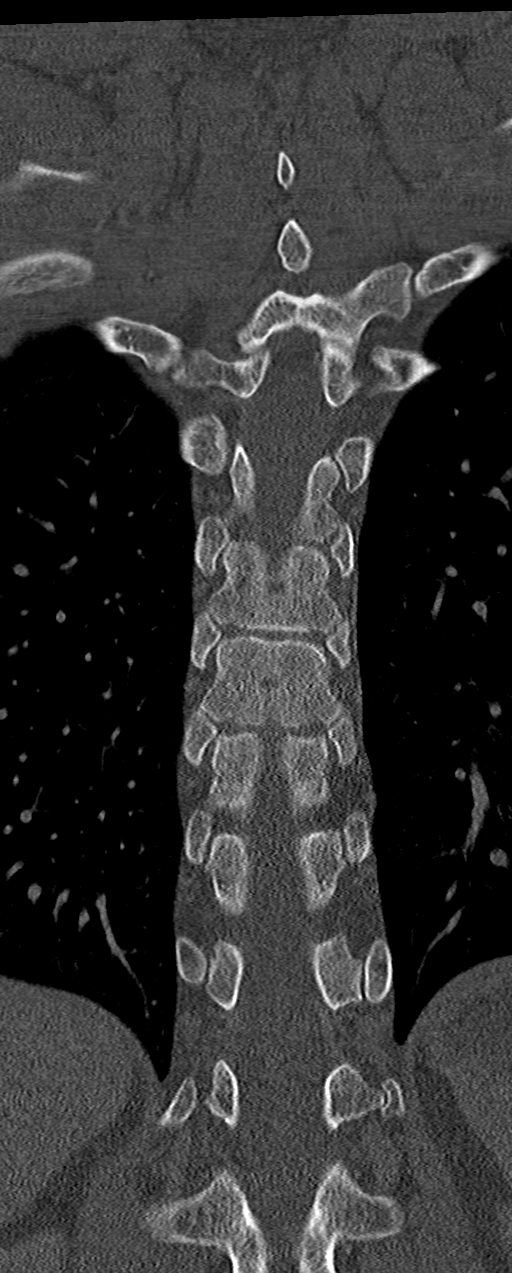

[10 of 33 positions shown; findings below may reference images not displayed]

Multidetector CT imaging of the cervical spine was performed without
intravenous contrast. Multiplanar CT image reconstructions were also
generated.

Multidetector CT imaging of the chest, abdomen and pelvis was
performed following the standard protocol during bolus
administration of intravenous contrast.

Multiplanar CT images of the thoracic and lumbar spine were
reconstructed from contemporary CT of the Chest, Abdomen, and Pelvis

CONTRAST:  100mL OMNIPAQUE IOHEXOL 300 MG/ML  SOLN
FINDINGS: CT HEAD FINDINGS

Brain: No evidence of acute infarction, hemorrhage, hydrocephalus,
extra-axial collection or mass lesion/mass effect.

Vascular: No hyperdense vessel or unexpected calcification.

Skull: Normal. Negative for fracture or focal lesion.

Sinuses/Orbits: There are mucosal retention cysts within the
bilateral maxillary sinuses, right greater than left. The remaining
paranasal sinuses and mastoid air cells are essentially clear.

Other: None.

CT CERVICAL FINDINGS

Alignment: Normal.

Skull base and vertebrae: No acute fracture. No primary bone lesion
or focal pathologic process.

Soft tissues and spinal canal: No prevertebral fluid or swelling. No
visible canal hematoma.

Disc levels: Minimal disc height loss is noted the lower cervical
segments.

Other:  None.

CT CHEST FINDINGS

Cardiovascular: The heart size is normal. There is no significant
pericardial effusion. No evidence for thoracic aortic aneurysm or
dissection. No evidence for large centrally located pulmonary
embolism.

Mediastinum/Nodes:

--No mediastinal or hilar lymphadenopathy.

--No axillary lymphadenopathy.

--No supraclavicular lymphadenopathy.

--Normal thyroid gland.

--The esophagus is unremarkable

Lungs/Pleura: There is a 5 mm pulmonary nodule in the right lower
lobe (axial series 5, image 94). There are additional scattered
bilateral pulmonary nodules measure less than 5 mm. There is no
pneumothorax or large pleural effusion. No focal infiltrate.

Musculoskeletal: No chest wall abnormality. No acute or significant
osseous findings.

CT ABDOMEN PELVIS FINDINGS

Hepatobiliary: The liver is normal. Normal gallbladder.There is no
biliary ductal dilation.

Pancreas: Normal contours without ductal dilatation. No
peripancreatic fluid collection.

Spleen: Unremarkable.

Adrenals/Urinary Tract:

--Adrenal glands: Unremarkable.

--Right kidney/ureter: No hydronephrosis or radiopaque kidney
stones.

--Left kidney/ureter: There are nonobstructing stones in the lower
pole the left kidney.

--Urinary bladder: Unremarkable.

Stomach/Bowel:

--Stomach/Duodenum: No hiatal hernia or other gastric abnormality.
Normal duodenal course and caliber.

--Small bowel: Unremarkable.

--Colon: Unremarkable.

--Appendix: Normal.

Vascular/Lymphatic: Normal course and caliber of the major abdominal
vessels.

--No retroperitoneal lymphadenopathy.

--No mesenteric lymphadenopathy.

--No pelvic or inguinal lymphadenopathy.

Reproductive: Unremarkable

Other: No ascites or free air. The abdominal wall is normal.

Musculoskeletal. There is an acute compression fracture involving
the superior endplate of the L1 vertebral body with approximately
15% height loss anteriorly. There is no retropulsion. There are 5
lumbar type vertebral bodies. Advanced degenerative changes are
noted at the L5-S1 level where there is moderate disc height loss
and developing facet arthrosis. There is a unilateral pars defect at
L4 without significant anterolisthesis. There is a broad-based
posterior disc bulge at the L4-L5 level without significant spinal
canal stenosis. There is a broad-based posterior disc bulge at the
L5-S1 level resulting in mild spinal canal narrowing.
IMPRESSION: 1. Acute compression fracture involving the superior endplate of the
L1 vertebral body with approximately 15% height loss anteriorly,
without retropulsion.
2. No acute thoracic spine fracture.
3. No acute abnormalities of the cervical spine.
4. No acute intracranial abnormality.
5. No solid organ injury within the chest, abdomen, or pelvis.
6. Nonobstructing left-sided nephrolithiasis.
7. Pulmonary nodules, measuring up to 5 mm. No follow-up needed if
patient is low risk. Non-contrast chest CT can be considered in 12
months if patient is high risk. This recommendation follows the
consensus statement: Guidelines for Management of Incidental
Pulmonary Nodules Detected on CT Images: From the [HOSPITAL]

## 2020-08-25 IMAGING — CT CT L SPINE W/O CM
3 series · 9 of 33 positions shown, 10 images · IV contrast (agent unspecified)
Comparison: None.

CLINICAL DATA: Acute pain due to trauma. Neck and back pain status
post jumping off a roof onto a trampoline

EXAM:
CT HEAD WITHOUT CONTRAST
CT CERVICAL SPINE WITHOUT CONTRAST
CT CHEST, ABDOMEN AND PELVIS WITH CONTRAST
CT THORACIC AND LUMBAR SPINE WITHOUT CONTRAST
TECHNIQUE: Contiguous axial images were obtained from the base of the skull
through the vertex without intravenous contrast.

[Series 2: l-soft · axial · 0.27mm/px · z∈[-742,-742]mm · 1 of 124 slices shown, 2 images]
[im 67/124  soft-tissue]
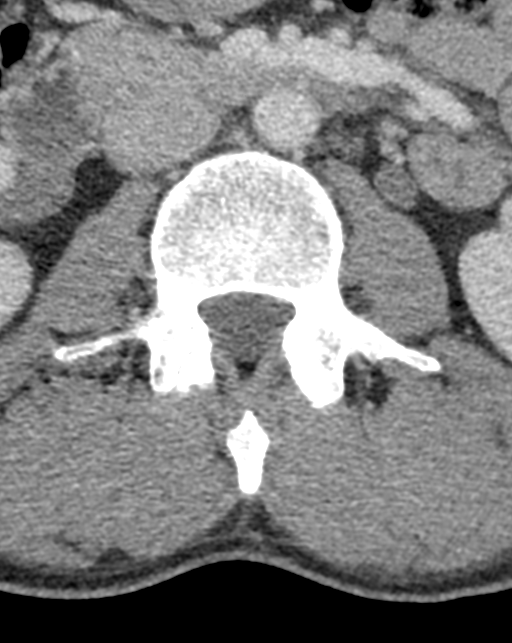
[im 67/124  bone]
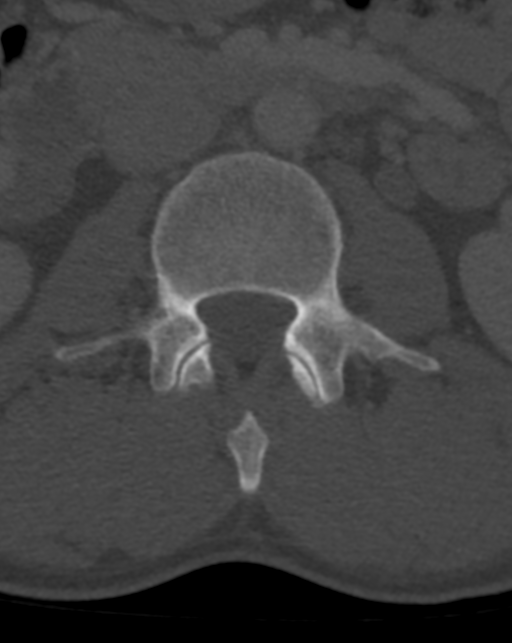

[Series 4: l-bone cor · coronal · 0.29mm/px · 3 of 67 slices shown]
[im 14/67  bone]
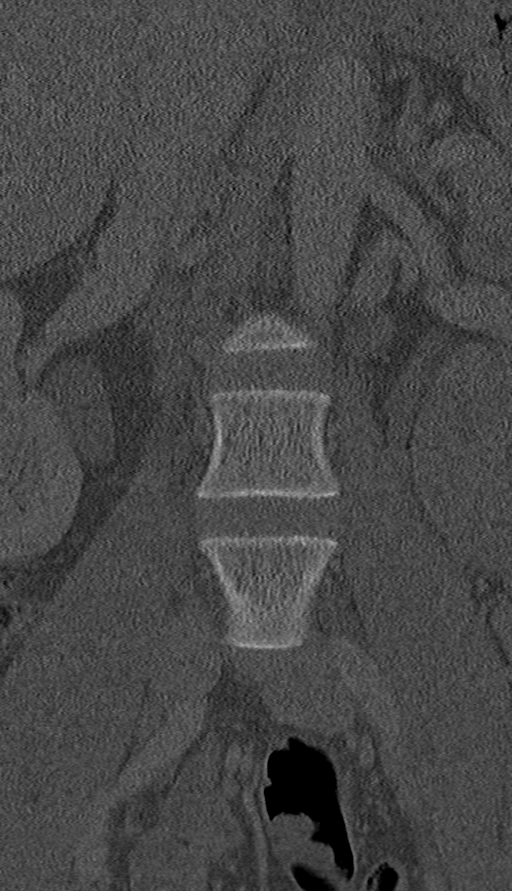
[im 27/67  bone]
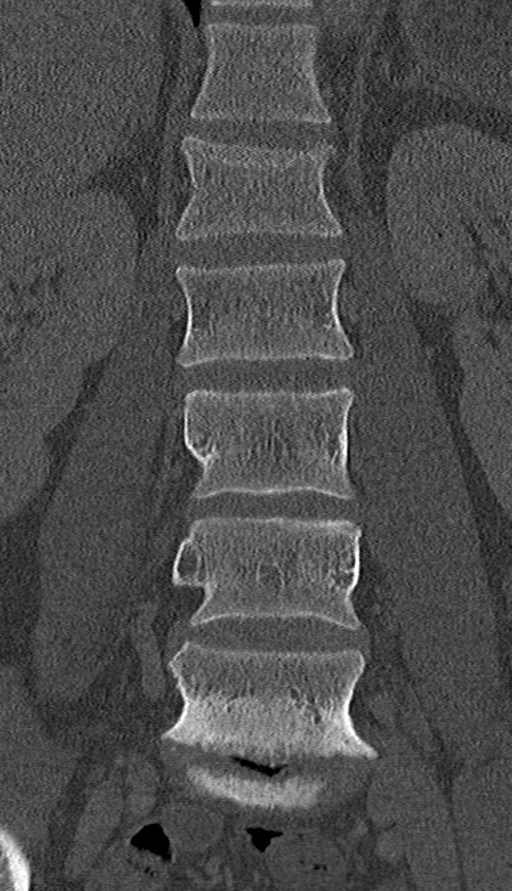
[im 40/67  bone]
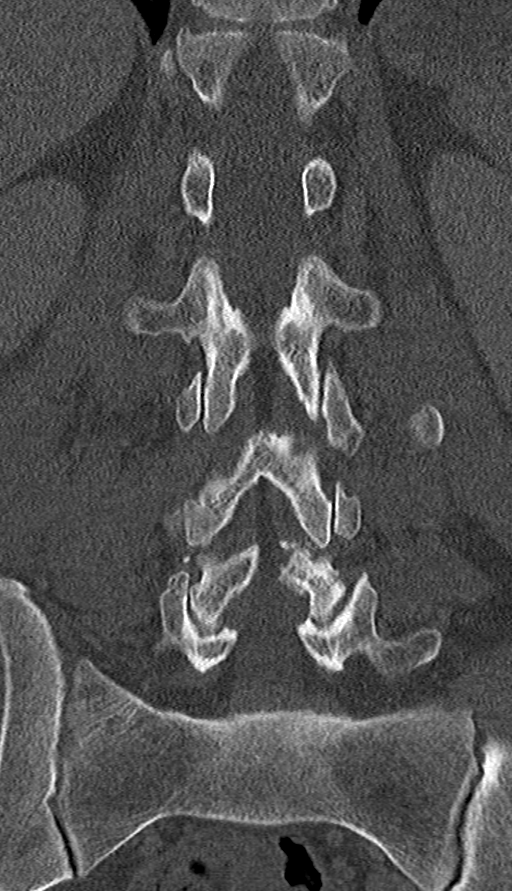

[Series 5: l-bone sag · sagittal · 0.26mm/px · 5 of 64 slices shown]
[im 22/64  bone]
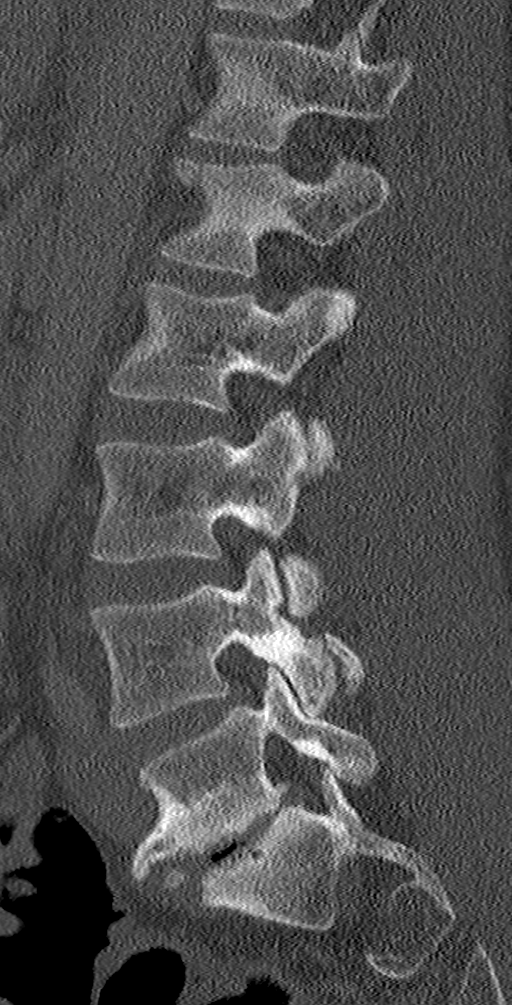
[im 27/64  bone]
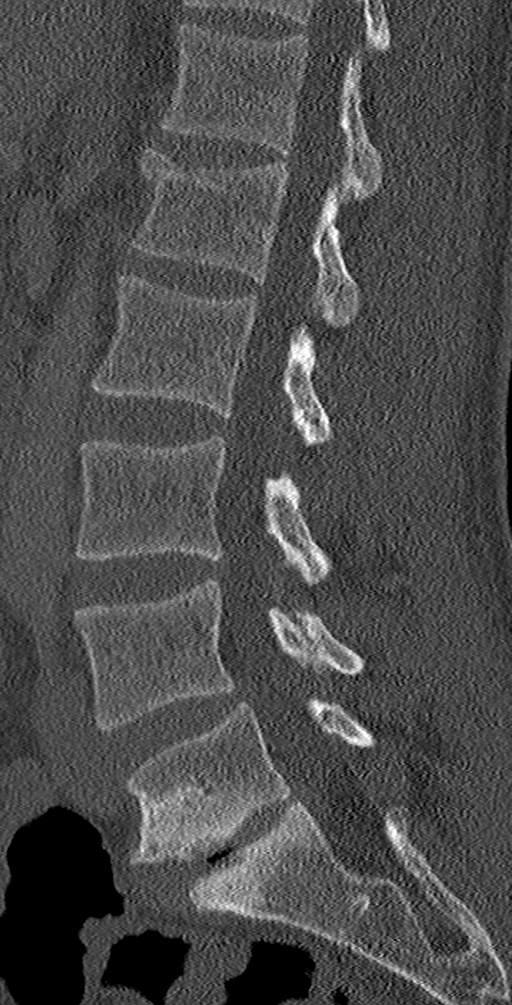
[im 32/64  bone]
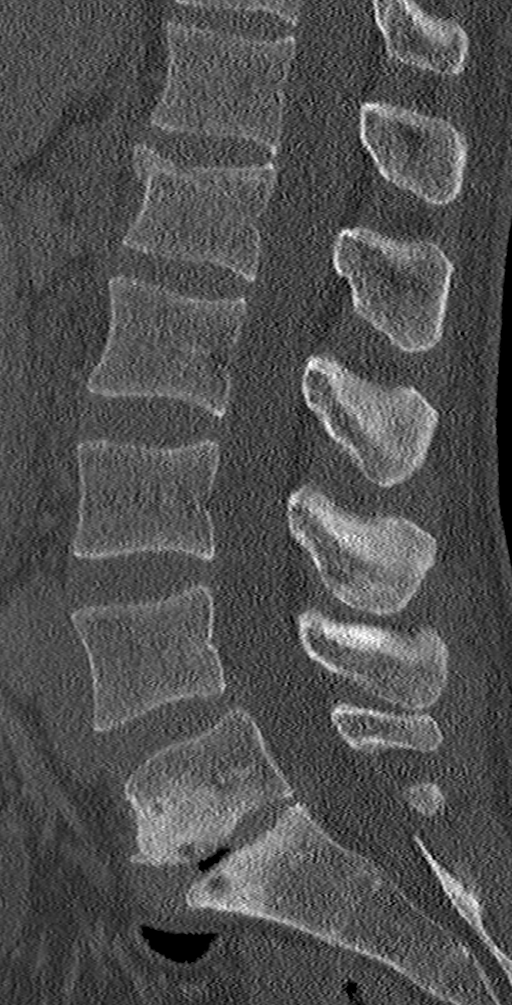
[im 37/64  bone]
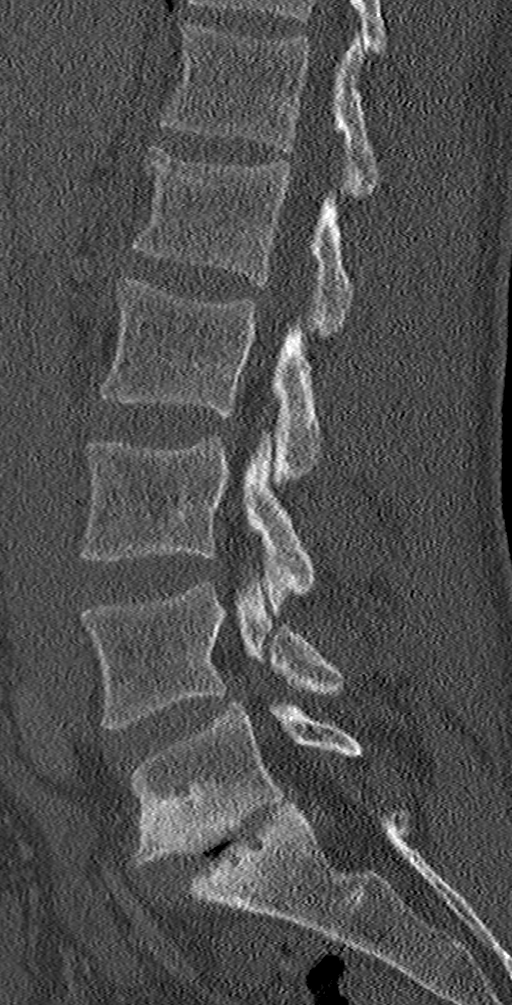
[im 43/64  bone]
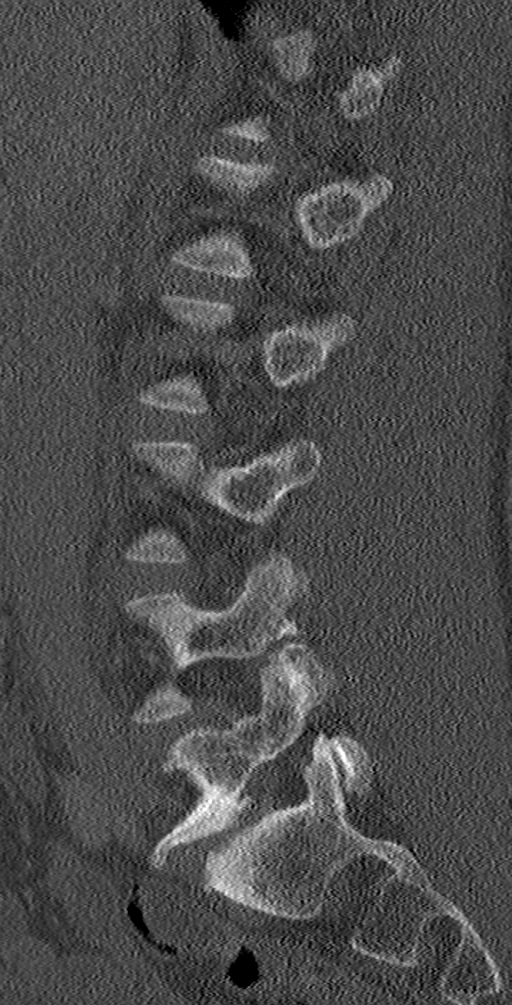

[9 of 33 positions shown; findings below may reference images not displayed]

Multidetector CT imaging of the cervical spine was performed without
intravenous contrast. Multiplanar CT image reconstructions were also
generated.

Multidetector CT imaging of the chest, abdomen and pelvis was
performed following the standard protocol during bolus
administration of intravenous contrast.

Multiplanar CT images of the thoracic and lumbar spine were
reconstructed from contemporary CT of the Chest, Abdomen, and Pelvis

CONTRAST:  100mL OMNIPAQUE IOHEXOL 300 MG/ML  SOLN
FINDINGS: CT HEAD FINDINGS

Brain: No evidence of acute infarction, hemorrhage, hydrocephalus,
extra-axial collection or mass lesion/mass effect.

Vascular: No hyperdense vessel or unexpected calcification.

Skull: Normal. Negative for fracture or focal lesion.

Sinuses/Orbits: There are mucosal retention cysts within the
bilateral maxillary sinuses, right greater than left. The remaining
paranasal sinuses and mastoid air cells are essentially clear.

Other: None.

CT CERVICAL FINDINGS

Alignment: Normal.

Skull base and vertebrae: No acute fracture. No primary bone lesion
or focal pathologic process.

Soft tissues and spinal canal: No prevertebral fluid or swelling. No
visible canal hematoma.

Disc levels: Minimal disc height loss is noted the lower cervical
segments.

Other:  None.

CT CHEST FINDINGS

Cardiovascular: The heart size is normal. There is no significant
pericardial effusion. No evidence for thoracic aortic aneurysm or
dissection. No evidence for large centrally located pulmonary
embolism.

Mediastinum/Nodes:

--No mediastinal or hilar lymphadenopathy.

--No axillary lymphadenopathy.

--No supraclavicular lymphadenopathy.

--Normal thyroid gland.

--The esophagus is unremarkable

Lungs/Pleura: There is a 5 mm pulmonary nodule in the right lower
lobe (axial series 5, image 94). There are additional scattered
bilateral pulmonary nodules measure less than 5 mm. There is no
pneumothorax or large pleural effusion. No focal infiltrate.

Musculoskeletal: No chest wall abnormality. No acute or significant
osseous findings.

CT ABDOMEN PELVIS FINDINGS

Hepatobiliary: The liver is normal. Normal gallbladder.There is no
biliary ductal dilation.

Pancreas: Normal contours without ductal dilatation. No
peripancreatic fluid collection.

Spleen: Unremarkable.

Adrenals/Urinary Tract:

--Adrenal glands: Unremarkable.

--Right kidney/ureter: No hydronephrosis or radiopaque kidney
stones.

--Left kidney/ureter: There are nonobstructing stones in the lower
pole the left kidney.

--Urinary bladder: Unremarkable.

Stomach/Bowel:

--Stomach/Duodenum: No hiatal hernia or other gastric abnormality.
Normal duodenal course and caliber.

--Small bowel: Unremarkable.

--Colon: Unremarkable.

--Appendix: Normal.

Vascular/Lymphatic: Normal course and caliber of the major abdominal
vessels.

--No retroperitoneal lymphadenopathy.

--No mesenteric lymphadenopathy.

--No pelvic or inguinal lymphadenopathy.

Reproductive: Unremarkable

Other: No ascites or free air. The abdominal wall is normal.

Musculoskeletal. There is an acute compression fracture involving
the superior endplate of the L1 vertebral body with approximately
15% height loss anteriorly. There is no retropulsion. There are 5
lumbar type vertebral bodies. Advanced degenerative changes are
noted at the L5-S1 level where there is moderate disc height loss
and developing facet arthrosis. There is a unilateral pars defect at
L4 without significant anterolisthesis. There is a broad-based
posterior disc bulge at the L4-L5 level without significant spinal
canal stenosis. There is a broad-based posterior disc bulge at the
L5-S1 level resulting in mild spinal canal narrowing.
IMPRESSION: 1. Acute compression fracture involving the superior endplate of the
L1 vertebral body with approximately 15% height loss anteriorly,
without retropulsion.
2. No acute thoracic spine fracture.
3. No acute abnormalities of the cervical spine.
4. No acute intracranial abnormality.
5. No solid organ injury within the chest, abdomen, or pelvis.
6. Nonobstructing left-sided nephrolithiasis.
7. Pulmonary nodules, measuring up to 5 mm. No follow-up needed if
patient is low risk. Non-contrast chest CT can be considered in 12
months if patient is high risk. This recommendation follows the
consensus statement: Guidelines for Management of Incidental
Pulmonary Nodules Detected on CT Images: From the [HOSPITAL]

## 2020-12-11 ENCOUNTER — Emergency Department (HOSPITAL_COMMUNITY)
Admission: EM | Admit: 2020-12-11 | Discharge: 2020-12-11 | Disposition: A | Discharge status: Home / Self Care | Payer: Medicaid Other | Attending: Emergency Medicine | Admitting: Emergency Medicine

## 2020-12-11 ENCOUNTER — Other Ambulatory Visit: Payer: Self-pay

## 2020-12-11 ENCOUNTER — Encounter (HOSPITAL_COMMUNITY): Payer: Self-pay | Admitting: *Deleted

## 2020-12-11 DIAGNOSIS — S90562A Insect bite (nonvenomous), left ankle, initial encounter: Secondary | ICD-10-CM | POA: Insufficient documentation

## 2020-12-11 DIAGNOSIS — S60562A Insect bite (nonvenomous) of left hand, initial encounter: Secondary | ICD-10-CM | POA: Insufficient documentation

## 2020-12-11 DIAGNOSIS — W57XXXA Bitten or stung by nonvenomous insect and other nonvenomous arthropods, initial encounter: Secondary | ICD-10-CM | POA: Insufficient documentation

## 2020-12-11 DIAGNOSIS — F17210 Nicotine dependence, cigarettes, uncomplicated: Secondary | ICD-10-CM | POA: Insufficient documentation

## 2020-12-11 DIAGNOSIS — T7840XA Allergy, unspecified, initial encounter: Secondary | ICD-10-CM

## 2020-12-11 DIAGNOSIS — R0602 Shortness of breath: Secondary | ICD-10-CM | POA: Insufficient documentation

## 2020-12-11 HISTORY — DX: Unspecified osteoarthritis, unspecified site: M19.90

## 2020-12-11 LAB — COMPREHENSIVE METABOLIC PANEL
ALT: 23 U/L (ref 0–44)
AST: 31 U/L (ref 15–41)
Albumin: 4.2 g/dL (ref 3.5–5.0)
Alkaline Phosphatase: 75 U/L (ref 38–126)
Anion gap: 7 (ref 5–15)
BUN: 21 mg/dL — ABNORMAL HIGH (ref 6–20)
CO2: 22 mmol/L (ref 22–32)
Calcium: 9.1 mg/dL (ref 8.9–10.3)
Chloride: 106 mmol/L (ref 98–111)
Creatinine, Ser: 0.7 mg/dL (ref 0.61–1.24)
GFR, Estimated: >60 mL/min (ref >60)
Glucose, Bld: 107 mg/dL — ABNORMAL HIGH (ref 70–99)
Potassium: 3.6 mmol/L (ref 3.5–5.1)
Sodium: 135 mmol/L (ref 135–145)
Total Bilirubin: 1 mg/dL (ref 0.3–1.2)
Total Protein: 7.6 g/dL (ref 6.5–8.1)

## 2020-12-11 LAB — CBC WITH DIFFERENTIAL/PLATELET
Abs Immature Granulocytes: 0.02 10*3/uL (ref 0.00–0.07)
Basophils Absolute: 0 10*3/uL (ref 0.0–0.1)
Basophils Relative: 0 %
Eosinophils Absolute: 0.1 10*3/uL (ref 0.0–0.5)
Eosinophils Relative: 1 %
HCT: 38.6 % — ABNORMAL LOW (ref 39.0–52.0)
Hemoglobin: 13.3 g/dL (ref 13.0–17.0)
Immature Granulocytes: 0 %
Lymphocytes Relative: 28 %
Lymphs Abs: 1.9 10*3/uL (ref 0.7–4.0)
MCH: 31.4 pg (ref 26.0–34.0)
MCHC: 34.5 g/dL (ref 30.0–36.0)
MCV: 91.3 fL (ref 80.0–100.0)
Monocytes Absolute: 0.7 10*3/uL (ref 0.1–1.0)
Monocytes Relative: 10 %
Neutro Abs: 4.1 10*3/uL (ref 1.7–7.7)
Neutrophils Relative %: 61 %
Platelets: 252 10*3/uL (ref 150–400)
RBC: 4.23 MIL/uL (ref 4.22–5.81)
RDW: 12.4 % (ref 11.5–15.5)
WBC: 6.7 10*3/uL (ref 4.0–10.5)
nRBC: 0 % (ref 0.0–0.2)

## 2020-12-11 MED ORDER — SODIUM CHLORIDE 0.9 % IV BOLUS
1000.0000 mL | Freq: Once | INTRAVENOUS | Status: AC
  Administered 2020-12-11: 1000 mL via INTRAVENOUS

## 2020-12-11 MED ORDER — METHYLPREDNISOLONE SODIUM SUCC 125 MG IJ SOLR
125.0000 mg | Freq: Once | INTRAMUSCULAR | Status: AC
  Administered 2020-12-11: 125 mg via INTRAVENOUS
  Filled 2020-12-11: qty 2

## 2020-12-11 MED ORDER — EPINEPHRINE 0.3 MG/0.3ML IJ SOAJ
0.3000 mg | Freq: Once | INTRAMUSCULAR | Status: AC
  Administered 2020-12-11: 0.3 mg via INTRAMUSCULAR

## 2020-12-11 MED ORDER — PREDNISONE 20 MG PO TABS: ORAL_TABLET | ORAL | 0 refills | Status: AC

## 2020-12-11 MED ORDER — DIPHENHYDRAMINE HCL 50 MG/ML IJ SOLN
50.0000 mg | Freq: Once | INTRAMUSCULAR | Status: AC
  Administered 2020-12-11: 50 mg via INTRAVENOUS
  Filled 2020-12-11: qty 1

## 2020-12-11 MED ORDER — FAMOTIDINE IN NACL 20-0.9 MG/50ML-% IV SOLN
20.0000 mg | Freq: Once | INTRAVENOUS | Status: AC
  Administered 2020-12-11: 20 mg via INTRAVENOUS
  Filled 2020-12-11: qty 50

## 2020-12-11 NOTE — ED Provider Notes (Signed)
[facility] EMERGENCY DEPARTMENT Provider Note   CSN: [id number] Arrival date & time: 12/11/20  1512     History Chief Complaint  Patient presents with   Allergic Reaction    Jon Medina is a 40 y.o. male.  Patient was stung by a couple bees.  He was stung on the left hand and left ankle.  Patient presented very short of breath  The history is provided by the patient and medical records. No language interpreter was used.  Allergic Reaction Presenting symptoms: difficulty breathing   Presenting symptoms: no rash   Prior allergic episodes:  No prior episodes Context: not animal exposure   Relieved by:  Nothing Worsened by:  Nothing Ineffective treatments:  Antihistamines     Past Medical History:  Diagnosis Date   Arthritis    Migraine     Patient Active Problem List   Diagnosis Date Noted   Lumbar discitis 10/17/2019   Lumbar compression fracture (HCC) 10/17/2019   Polysubstance abuse (HCC) 10/17/2019   IVDU (intravenous drug user) 10/17/2019    Past Surgical History:  Procedure Laterality Date   CARPAL TUNNEL RELEASE     endoscopy     TONSILLECTOMY         No family history on file.  Social History   Tobacco Use   Smoking status: Every Day    Packs/day: 0.50    Types: Cigarettes   Smokeless tobacco: Never  Vaping Use   Vaping Use: Never used  Substance Use Topics   Alcohol use: Not Currently   Drug use: Not Currently    Home Medications Prior to Admission medications   Medication Sig Start Date End Date Taking? Authorizing Provider  predniSONE (DELTASONE) 20 MG tablet 2 tabs po daily x 3 days 12/11/20  Yes Bethann Berkshire, MD  acetaminophen (TYLENOL) 325 MG tablet Take 650 mg by mouth every 6 (six) hours as needed for mild pain, moderate pain, fever or headache.    [provider]  aspirin-sod bicarb-citric acid (ALKA-SELTZER) 325 MG TBEF tablet Take 325 mg by mouth every 6 (six) hours as needed.    [provider]   buprenorphine-naloxone (SUBOXONE) 8-2 mg SUBL SL tablet Place 1 tablet under the tongue daily. 02/22/19   [provider]  naproxen (NAPROSYN) 500 MG tablet Take 1 tablet (500 mg total) by mouth 2 (two) times daily with a meal. 01/03/20 01/02/21  Nita Sickle, MD    Allergies    Patient has no known allergies.  Review of Systems   Review of Systems  Constitutional:  Negative for appetite change and fatigue.  HENT:  Negative for congestion, ear discharge and sinus pressure.   Eyes:  Negative for discharge.  Respiratory:  Positive for shortness of breath. Negative for cough.   Cardiovascular:  Negative for chest pain.  Gastrointestinal:  Negative for abdominal pain and diarrhea.  Genitourinary:  Negative for frequency and hematuria.  Musculoskeletal:  Negative for back pain.  Skin:  Negative for rash.  Neurological:  Negative for seizures and headaches.  Psychiatric/Behavioral:  Negative for hallucinations.    Physical Exam Updated Vital Signs BP 125/78   Pulse 72   Temp 97.6 F (36.4 C) (Oral)   Resp 12   Ht 6' (1.829 m)   Wt 90.7 kg   SpO2 95%   BMI 27.12 kg/m   Physical Exam Constitutional:      Appearance: He is well-developed.  HENT:     Head: Normocephalic.     Nose: Nose  normal.  Eyes:     General: No scleral icterus.    Conjunctiva/sclera: Conjunctivae normal.  Neck:     Thyroid: No thyromegaly.  Cardiovascular:     Rate and Rhythm: Normal rate and regular rhythm.     Heart sounds: No murmur heard.   No friction rub. No gallop.  Pulmonary:     Breath sounds: No stridor. No wheezing or rales.     Comments: Tachypnea Chest:     Chest wall: No tenderness.  Abdominal:     General: There is no distension.     Tenderness: There is no abdominal tenderness. There is no rebound.  Musculoskeletal:        General: Normal range of motion.     Cervical back: Neck supple.     Comments: Mild swelling to left hand and left ankle  Lymphadenopathy:      Cervical: No cervical adenopathy.  Skin:    Findings: No erythema or rash.  Neurological:     Mental Status: He is alert and oriented to person, place, and time.     Motor: No abnormal muscle tone.     Coordination: Coordination normal.  Psychiatric:        Behavior: Behavior normal.    ED Results / Procedures / Treatments   Labs (all labs ordered are listed, but only abnormal results are displayed) Labs Reviewed  CBC WITH DIFFERENTIAL/PLATELET - Abnormal; Notable for the following components:      Result Value   HCT 38.6 (*)    All other components within normal limits  COMPREHENSIVE METABOLIC PANEL - Abnormal; Notable for the following components:   Glucose, Bld 107 (*)    BUN 21 (*)    All other components within normal limits    EKG EKG Interpretation  Date/Time:  Friday December 11 2020 15:21:48 EDT Ventricular Rate:  73 PR Interval:  157 QRS Duration: 99 QT Interval:  412 QTC Calculation: 454 R Axis:   77 Text Interpretation: Sinus rhythm Confirmed by Bethann Berkshire [phone]) on 12/11/2020 5:18:26 PM  Radiology No results found.  Procedures Procedures   Medications Ordered in ED Medications  EPINEPHrine (EPI-PEN) injection 0.3 mg (0.3 mg Intramuscular Given 12/11/20 1511)  famotidine (PEPCID) IVPB 20 mg premix (0 mg Intravenous Stopped 12/11/20 1601)  diphenhydrAMINE (BENADRYL) injection 50 mg (50 mg Intravenous Given by Other 12/11/20 1521)  methylPREDNISolone sodium succinate (SOLU-MEDROL) 125 mg/2 mL injection 125 mg (125 mg Intravenous Given 12/11/20 1522)  sodium chloride 0.9 % bolus 1,000 mL (0 mLs Intravenous Stopped 12/11/20 1700)    ED Course  I have reviewed the triage vital signs and the nursing notes.  Pertinent labs & imaging results that were available during my care of the patient were reviewed by me and considered in my medical decision making (see chart for details). Patient with a local reaction to bee sting with what appears to be significant  anxiety from the bee sting.  Patient did well with Benadryl steroids epi fluids and Pepcid.  I do not think this was a true allergic reaction.  We will discharge him home with steroids and Benadryl   MDM Rules/Calculators/A&P                           Bee sting with local reaction to left hand and left foot and anxiety reaction Final Clinical Impression(s) / ED Diagnoses Final diagnoses:  Allergic reaction, initial encounter    Rx / DC Orders  ED Discharge Orders          Ordered    predniSONE (DELTASONE) 20 MG tablet        12/11/20 Erin Sons, MD 12/11/20 1723

## 2020-12-11 NOTE — ED Triage Notes (Signed)
Pt c/o multiple bee stings prior to arrival to ED. Pt shaking all over and c/o tachypneic. Dr. Estell Harpin called to bedside.

## 2020-12-11 NOTE — Discharge Instructions (Addendum)
Take Benadryl 25 mg every 4 hours as needed for itching or rash.  Follow-up with your family doctor in a couple days for recheck.  Drink plenty of fluids
